# Patient Record
Sex: Female | Born: 1990 | Hispanic: Yes | Marital: Married | Smoking: Never smoker
Health system: Southern US, Community
[De-identification: ages and names within clinical notes are randomized; demographics above are authoritative.]

## PROBLEM LIST (undated history)

## (undated) ENCOUNTER — Emergency Department (HOSPITAL_BASED_OUTPATIENT_CLINIC_OR_DEPARTMENT_OTHER): Payer: BLUE CROSS/BLUE SHIELD

## (undated) DIAGNOSIS — Z789 Other specified health status: Secondary | ICD-10-CM

## (undated) DIAGNOSIS — Z8619 Personal history of other infectious and parasitic diseases: Secondary | ICD-10-CM

## (undated) HISTORY — PX: APPENDECTOMY: SHX54

## (undated) HISTORY — DX: Personal history of other infectious and parasitic diseases: Z86.19

---

## 2015-08-23 NOTE — L&D Delivery Note (Signed)
Delivery Note At 2:27 AM a viable female was delivered via Vaginal, Spontaneous Delivery (Presentation: vtx; LOA ).  APGAR: 9, 9; weight pending.   Placenta status: spontaneous, intact.  Cord:  with the following complications: none.  Anesthesia:  Epidural Episiotomy: Median Lacerations: Labial;Sulcus;2nd degree Suture Repair: 2.0 vicryl and 3-0 Vicryl rapide Est. Blood Loss (mL):  300  Mom to postpartum.  Baby to Couplet care / Skin to Skin.  Kimori Tartaglia D 05/08/2016, 3:04 AM

## 2016-01-04 LAB — OB RESULTS CONSOLE GC/CHLAMYDIA
Chlamydia: NEGATIVE
GC PROBE AMP, GENITAL: NEGATIVE

## 2016-01-04 LAB — OB RESULTS CONSOLE ANTIBODY SCREEN: ANTIBODY SCREEN: NEGATIVE

## 2016-01-04 LAB — OB RESULTS CONSOLE ABO/RH: RH Type: POSITIVE

## 2016-01-04 LAB — OB RESULTS CONSOLE HIV ANTIBODY (ROUTINE TESTING): HIV: NONREACTIVE

## 2016-01-04 LAB — OB RESULTS CONSOLE HEPATITIS B SURFACE ANTIGEN: Hepatitis B Surface Ag: NEGATIVE

## 2016-01-04 LAB — OB RESULTS CONSOLE RPR: RPR: NONREACTIVE

## 2016-01-04 LAB — OB RESULTS CONSOLE RUBELLA ANTIBODY, IGM: Rubella: IMMUNE

## 2016-04-07 LAB — OB RESULTS CONSOLE GC/CHLAMYDIA
CHLAMYDIA, DNA PROBE: NEGATIVE
Gonorrhea: NEGATIVE

## 2016-04-07 LAB — OB RESULTS CONSOLE GBS: STREP GROUP B AG: POSITIVE

## 2016-05-04 ENCOUNTER — Encounter (HOSPITAL_COMMUNITY): Payer: Self-pay | Admitting: *Deleted

## 2016-05-04 ENCOUNTER — Inpatient Hospital Stay (HOSPITAL_COMMUNITY)
Admission: AD | Admit: 2016-05-04 | Discharge: 2016-05-05 | Disposition: A | Discharge status: Home / Self Care | Payer: BLUE CROSS/BLUE SHIELD | Source: Ambulatory Visit | Attending: Obstetrics and Gynecology | Admitting: Obstetrics and Gynecology

## 2016-05-04 HISTORY — DX: Other specified health status: Z78.9

## 2016-05-04 NOTE — Discharge Instructions (Signed)
Third Trimester of Pregnancy The third trimester is from week 29 through week 42, months 7 through 9. This trimester is when your unborn baby (fetus) is growing very fast. At the end of the ninth month, the unborn baby is about 20 inches in length. It weighs about 6-10 pounds.  HOME CARE   Avoid all smoking, herbs, and alcohol. Avoid drugs not approved by your doctor.  Do not use any tobacco products, including cigarettes, chewing tobacco, and electronic cigarettes. If you need help quitting, ask your doctor. You may get counseling or other support to help you quit.  Only take medicine as told by your doctor. Some medicines are safe and some are not during pregnancy.  Exercise only as told by your doctor. Stop exercising if you start having cramps.  Eat regular, healthy meals.  Wear a good support bra if your breasts are tender.  Do not use hot tubs, steam rooms, or saunas.  Wear your seat belt when driving.  Avoid raw meat, uncooked cheese, and liter boxes and soil used by cats.  Take your prenatal vitamins.  Take 1500-2000 milligrams of calcium daily starting at the 20th week of pregnancy until you deliver your baby.  Try taking medicine that helps you poop (stool softener) as needed, and if your doctor approves. Eat more fiber by eating fresh fruit, vegetables, and whole grains. Drink enough fluids to keep your pee (urine) clear or pale yellow.  Take warm water baths (sitz baths) to soothe pain or discomfort caused by hemorrhoids. Use hemorrhoid cream if your doctor approves.  If you have puffy, bulging veins (varicose veins), wear support hose. Raise (elevate) your feet for 15 minutes, 3-4 times a day. Limit salt in your diet.  Avoid heavy lifting, wear low heels, and sit up straight.  Rest with your legs raised if you have leg cramps or low back pain.  Visit your dentist if you have not gone during your pregnancy. Use a soft toothbrush to brush your teeth. Be gentle when you  floss.  You can have sex (intercourse) unless your doctor tells you not to.  Do not travel far distances unless you must. Only do so with your doctor's approval.  Take prenatal classes.  Practice driving to the hospital.  Pack your hospital bag.  Prepare the baby's room.  Go to your doctor visits. GET HELP IF:  You are not sure if you are in labor or if your water has broken.  You are dizzy.  You have mild cramps or pressure in your lower belly (abdominal).  You have a nagging pain in your belly area.  You continue to feel sick to your stomach (nauseous), throw up (vomit), or have watery poop (diarrhea).  You have bad smelling fluid coming from your vagina.  You have pain with peeing (urination). GET HELP RIGHT AWAY IF:   You have a fever.  You are leaking fluid from your vagina.  You are spotting or bleeding from your vagina.  You have severe belly cramping or pain.  You lose or gain weight rapidly.  You have trouble catching your breath and have chest pain.  You notice sudden or extreme puffiness (swelling) of your face, hands, ankles, feet, or legs.  You have not felt the baby move in over an hour.  You have severe headaches that do not go away with medicine.  You have vision changes.   This information is not intended to replace advice given to you by your health care  provider. Make sure you discuss any questions you have with your health care provider.   Document Released: 11/02/2009 Document Revised: 08/29/2014 Document Reviewed: 10/09/2012 Elsevier Interactive Patient Education 2016 [employer] Fetal Movement Counts Patient Name: __________________________________________________ Patient Due Date: ____________________ Performing a fetal movement count is highly recommended in high-risk pregnancies, but it is good for every pregnant woman to do. Your health care provider may ask you to start counting fetal movements at 28 weeks of the pregnancy.  Fetal movements often increase:  After eating a full meal.  After physical activity.  After eating or drinking something sweet or cold.  At rest. Pay attention to when you feel the baby is most active. This will help you notice a pattern of your baby's sleep and wake cycles and what factors contribute to an increase in fetal movement. It is important to perform a fetal movement count at the same time each day when your baby is normally most active.  HOW TO COUNT FETAL MOVEMENTS 1. Find a quiet and comfortable area to sit or lie down on your left side. Lying on your left side provides the best blood and oxygen circulation to your baby. 2. Write down the day and time on a sheet of paper or in a journal. 3. Start counting kicks, flutters, swishes, rolls, or jabs in a 2-hour period. You should feel at least 10 movements within 2 hours. 4. If you do not feel 10 movements in 2 hours, wait 2-3 hours and count again. Look for a change in the pattern or not enough counts in 2 hours. SEEK MEDICAL CARE IF:  You feel less than 10 counts in 2 hours, tried twice.  There is no movement in over an hour.  The pattern is changing or taking longer each day to reach 10 counts in 2 hours.  You feel the baby is not moving as he or she usually does. Date: ____________ Movements: ____________ Start time: ____________ Doreatha MartinFinish time: ____________  Date: ____________ Movements: ____________ Start time: ____________ Doreatha MartinFinish time: ____________ Date: ____________ Movements: ____________ Start time: ____________ Doreatha MartinFinish time: ____________ Date: ____________ Movements: ____________ Start time: ____________ Doreatha MartinFinish time: ____________ Date: ____________ Movements: ____________ Start time: ____________ Doreatha MartinFinish time: ____________ Date: ____________ Movements: ____________ Start time: ____________ Doreatha MartinFinish time: ____________ Date: ____________ Movements: ____________ Start time: ____________ Doreatha MartinFinish time: ____________ Date:  ____________ Movements: ____________ Start time: ____________ Doreatha MartinFinish time: ____________  Date: ____________ Movements: ____________ Start time: ____________ Doreatha MartinFinish time: ____________ Date: ____________ Movements: ____________ Start time: ____________ Doreatha MartinFinish time: ____________ Date: ____________ Movements: ____________ Start time: ____________ Doreatha MartinFinish time: ____________ Date: ____________ Movements: ____________ Start time: ____________ Doreatha MartinFinish time: ____________ Date: ____________ Movements: ____________ Start time: ____________ Doreatha MartinFinish time: ____________ Date: ____________ Movements: ____________ Start time: ____________ Doreatha MartinFinish time: ____________ Date: ____________ Movements: ____________ Start time: ____________ Doreatha MartinFinish time: ____________  Date: ____________ Movements: ____________ Start time: ____________ Doreatha MartinFinish time: ____________ Date: ____________ Movements: ____________ Start time: ____________ Doreatha MartinFinish time: ____________ Date: ____________ Movements: ____________ Start time: ____________ Doreatha MartinFinish time: ____________ Date: ____________ Movements: ____________ Start time: ____________ Doreatha MartinFinish time: ____________ Date: ____________ Movements: ____________ Start time: ____________ Doreatha MartinFinish time: ____________ Date: ____________ Movements: ____________ Start time: ____________ Doreatha MartinFinish time: ____________ Date: ____________ Movements: ____________ Start time: ____________ Doreatha MartinFinish time: ____________  Date: ____________ Movements: ____________ Start time: ____________ Doreatha MartinFinish time: ____________ Date: ____________ Movements: ____________ Start time: ____________ Doreatha MartinFinish time: ____________ Date: ____________ Movements: ____________ Start time: ____________ Doreatha MartinFinish time: ____________ Date: ____________ Movements: ____________ Start time: ____________ Doreatha MartinFinish time: ____________ Date: ____________ Movements: ____________ Start time: ____________  Finish time: ____________ Date: ____________ Movements: ____________ Start  time: ____________ Doreatha Martin time: ____________ Date: ____________ Movements: ____________ Start time: ____________ Doreatha Martin time: ____________  Date: ____________ Movements: ____________ Start time: ____________ Doreatha Martin time: ____________ Date: ____________ Movements: ____________ Start time: ____________ Doreatha Martin time: ____________ Date: ____________ Movements: ____________ Start time: ____________ Doreatha Martin time: ____________ Date: ____________ Movements: ____________ Start time: ____________ Doreatha Martin time: ____________ Date: ____________ Movements: ____________ Start time: ____________ Doreatha Martin time: ____________ Date: ____________ Movements: ____________ Start time: ____________ Doreatha Martin time: ____________ Date: ____________ Movements: ____________ Start time: ____________ Doreatha Martin time: ____________  Date: ____________ Movements: ____________ Start time: ____________ Doreatha Martin time: ____________ Date: ____________ Movements: ____________ Start time: ____________ Doreatha Martin time: ____________ Date: ____________ Movements: ____________ Start time: ____________ Doreatha Martin time: ____________ Date: ____________ Movements: ____________ Start time: ____________ Doreatha Martin time: ____________ Date: ____________ Movements: ____________ Start time: ____________ Doreatha Martin time: ____________ Date: ____________ Movements: ____________ Start time: ____________ Doreatha Martin time: ____________ Date: ____________ Movements: ____________ Start time: ____________ Doreatha Martin time: ____________  Date: ____________ Movements: ____________ Start time: ____________ Doreatha Martin time: ____________ Date: ____________ Movements: ____________ Start time: ____________ Doreatha Martin time: ____________ Date: ____________ Movements: ____________ Start time: ____________ Doreatha Martin time: ____________ Date: ____________ Movements: ____________ Start time: ____________ Doreatha Martin time: ____________ Date: ____________ Movements: ____________ Start time: ____________ Doreatha Martin time:  ____________ Date: ____________ Movements: ____________ Start time: ____________ Doreatha Martin time: ____________ Date: ____________ Movements: ____________ Start time: ____________ Doreatha Martin time: ____________  Date: ____________ Movements: ____________ Start time: ____________ Doreatha Martin time: ____________ Date: ____________ Movements: ____________ Start time: ____________ Doreatha Martin time: ____________ Date: ____________ Movements: ____________ Start time: ____________ Doreatha Martin time: ____________ Date: ____________ Movements: ____________ Start time: ____________ Doreatha Martin time: ____________ Date: ____________ Movements: ____________ Start time: ____________ Doreatha Martin time: ____________ Date: ____________ Movements: ____________ Start time: ____________ Doreatha Martin time: ____________   This information is not intended to replace advice given to you by your health care provider. Make sure you discuss any questions you have with your health care provider.   Document Released: 09/07/2006 Document Revised: 08/29/2014 Document Reviewed: 06/04/2012 Elsevier Interactive Patient Education 2016 [employer].   Copyright  VHI. All rights reserved.  Braxton Hicks Contractions Contractions of the uterus can occur throughout pregnancy. Contractions are not always a sign that you are in labor.  WHAT ARE BRAXTON HICKS CONTRACTIONS?  Contractions that occur before labor are called Braxton Hicks contractions, or false labor. Toward the end of pregnancy (32-34 weeks), these contractions can develop more often and may become more forceful. This is not true labor because these contractions do not result in opening (dilatation) and thinning of the cervix. They are sometimes difficult to tell apart from true labor because these contractions can be forceful and people have different pain tolerances. You should not feel embarrassed if you go to the hospital with false labor. Sometimes, the only way to tell if you are in true labor is for your  health care provider to look for changes in the cervix. If there are no prenatal problems or other health problems associated with the pregnancy, it is completely safe to be sent home with false labor and await the onset of true labor. HOW CAN YOU TELL THE DIFFERENCE BETWEEN TRUE AND FALSE LABOR? False Labor  The contractions of false labor are usually shorter and not as hard as those of true labor.   The contractions are usually irregular.   The contractions are often felt in the front of the lower abdomen and in the groin.   The contractions may go away when you walk around or change positions  while lying down.   The contractions get weaker and are shorter lasting as time goes on.   The contractions do not usually become progressively stronger, regular, and closer together as with true labor.  True Labor  Contractions in true labor last 30-70 seconds, become very regular, usually become more intense, and increase in frequency.   The contractions do not go away with walking.   The discomfort is usually felt in the top of the uterus and spreads to the lower abdomen and low back.   True labor can be determined by your health care provider with an exam. This will show that the cervix is dilating and getting thinner.  WHAT TO REMEMBER  Keep up with your usual exercises and follow other instructions given by your health care provider.   Take medicines as directed by your health care provider.   Keep your regular prenatal appointments.   Eat and drink lightly if you think you are going into labor.   If Braxton Hicks contractions are making you uncomfortable:   Change your position from lying down or resting to walking, or from walking to resting.   Sit and rest in a tub of warm water.   Drink 2-3 glasses of water. Dehydration may cause these contractions.   Do slow and deep breathing several times an hour.  WHEN SHOULD I SEEK IMMEDIATE MEDICAL CARE? Seek  immediate medical care if:  Your contractions become stronger, more regular, and closer together.   You have fluid leaking or gushing from your vagina.   You have a fever.   You pass blood-tinged mucus.   You have vaginal bleeding.   You have continuous abdominal pain.   You have low back pain that you never had before.   You feel your baby's head pushing down and causing pelvic pressure.   Your baby is not moving as much as it used to.    This information is not intended to replace advice given to you by your health care provider. Make sure you discuss any questions you have with your health care provider.   Document Released: 08/08/2005 Document Revised: 08/13/2013 Document Reviewed: 05/20/2013 Elsevier Interactive Patient Education [employer]

## 2016-05-04 NOTE — MAU Note (Signed)
Pt reports contractions and pressure. Also states she is having a stabbing pain in her lower back.

## 2016-05-05 LAB — AMNISURE RUPTURE OF MEMBRANE (ROM) NOT AT ARMC: AMNISURE: NEGATIVE

## 2016-05-05 NOTE — Progress Notes (Signed)
Pt was given d/c instructions and pt and husband walked to car and they  came back inside MAU because she sneezed and something warm damp came out.  Notified Thressa ShellerHeather Hogan CNM and Amnisure was ordered.  No fluid coming out now.

## 2016-05-05 NOTE — Progress Notes (Signed)
Notified pt amnisure is neg, proceeded with d/c home as previous.

## 2016-05-06 ENCOUNTER — Encounter (HOSPITAL_COMMUNITY): Payer: Self-pay | Admitting: *Deleted

## 2016-05-06 ENCOUNTER — Telehealth (HOSPITAL_COMMUNITY): Payer: Self-pay | Admitting: *Deleted

## 2016-05-06 NOTE — Telephone Encounter (Signed)
Preadmission screen  

## 2016-05-07 ENCOUNTER — Inpatient Hospital Stay (HOSPITAL_COMMUNITY): Payer: BLUE CROSS/BLUE SHIELD | Admitting: Anesthesiology

## 2016-05-07 ENCOUNTER — Inpatient Hospital Stay (HOSPITAL_COMMUNITY)
Admission: AD | Admit: 2016-05-07 | Discharge: 2016-05-10 | DRG: 775 | Disposition: A | Discharge status: Home / Self Care | Payer: BLUE CROSS/BLUE SHIELD | Source: Ambulatory Visit | Attending: Obstetrics and Gynecology | Admitting: Obstetrics and Gynecology

## 2016-05-07 ENCOUNTER — Encounter (HOSPITAL_COMMUNITY): Payer: Self-pay | Admitting: Certified Nurse Midwife

## 2016-05-07 DIAGNOSIS — Z3A39 39 weeks gestation of pregnancy: Secondary | ICD-10-CM

## 2016-05-07 DIAGNOSIS — O4202 Full-term premature rupture of membranes, onset of labor within 24 hours of rupture: Secondary | ICD-10-CM | POA: Diagnosis present

## 2016-05-07 DIAGNOSIS — O4290 Premature rupture of membranes, unspecified as to length of time between rupture and onset of labor, unspecified weeks of gestation: Secondary | ICD-10-CM | POA: Diagnosis present

## 2016-05-07 DIAGNOSIS — O99824 Streptococcus B carrier state complicating childbirth: Secondary | ICD-10-CM | POA: Diagnosis present

## 2016-05-07 DIAGNOSIS — Z8249 Family history of ischemic heart disease and other diseases of the circulatory system: Secondary | ICD-10-CM | POA: Diagnosis not present

## 2016-05-07 LAB — POCT FERN TEST

## 2016-05-07 LAB — CBC
HEMATOCRIT: 33.2 % — AB (ref 36.0–46.0)
Hemoglobin: 11.4 g/dL — ABNORMAL LOW (ref 12.0–15.0)
MCH: 29.2 pg (ref 26.0–34.0)
MCHC: 34.3 g/dL (ref 30.0–36.0)
MCV: 85.1 fL (ref 78.0–100.0)
Platelets: 204 10*3/uL (ref 150–400)
RBC: 3.9 MIL/uL (ref 3.87–5.11)
RDW: 14.6 % (ref 11.5–15.5)
WBC: 12.3 10*3/uL — AB (ref 4.0–10.5)

## 2016-05-07 LAB — TYPE AND SCREEN
ABO/RH(D): O POS
ANTIBODY SCREEN: NEGATIVE

## 2016-05-07 LAB — ABO/RH: ABO/RH(D): O POS

## 2016-05-07 MED ORDER — EPHEDRINE 5 MG/ML INJ
10.0000 mg | INTRAVENOUS | Status: DC | PRN
  Filled 2016-05-07: qty 4

## 2016-05-07 MED ORDER — OXYCODONE-ACETAMINOPHEN 5-325 MG PO TABS: 2.0000 | ORAL_TABLET | ORAL | Status: DC | PRN

## 2016-05-07 MED ORDER — FENTANYL 2.5 MCG/ML BUPIVACAINE 1/10 % EPIDURAL INFUSION (WH - ANES)
14.0000 mL/h | INTRAMUSCULAR | Status: DC | PRN
Start: 2016-05-07 — End: 2016-05-08
  Administered 2016-05-07 – 2016-05-08 (×2): 14 mL/h via EPIDURAL
  Filled 2016-05-07 (×2): qty 125

## 2016-05-07 MED ORDER — PENICILLIN G POTASSIUM 5000000 UNITS IJ SOLR
2.5000 10*6.[IU] | INTRAMUSCULAR | Status: DC
  Administered 2016-05-07 (×2): 2.5 10*6.[IU] via INTRAVENOUS
  Filled 2016-05-07 (×6): qty 2.5

## 2016-05-07 MED ORDER — LIDOCAINE HCL (PF) 1 % IJ SOLN
INTRAMUSCULAR | Status: DC | PRN
  Administered 2016-05-07 (×2): 7 mL via EPIDURAL

## 2016-05-07 MED ORDER — OXYTOCIN 40 UNITS IN LACTATED RINGERS INFUSION - SIMPLE MED: 2.5000 [IU]/h | INTRAVENOUS | Status: DC

## 2016-05-07 MED ORDER — ACETAMINOPHEN 325 MG PO TABS: 650.0000 mg | ORAL_TABLET | ORAL | Status: DC | PRN

## 2016-05-07 MED ORDER — PENICILLIN G POTASSIUM 5000000 UNITS IJ SOLR
5.0000 10*6.[IU] | Freq: Once | INTRAVENOUS | Status: AC
  Administered 2016-05-07: 5 10*6.[IU] via INTRAVENOUS
  Filled 2016-05-07: qty 5

## 2016-05-07 MED ORDER — DIPHENHYDRAMINE HCL 50 MG/ML IJ SOLN: 12.5000 mg | INTRAMUSCULAR | Status: DC | PRN

## 2016-05-07 MED ORDER — PHENYLEPHRINE 40 MCG/ML (10ML) SYRINGE FOR IV PUSH (FOR BLOOD PRESSURE SUPPORT)
80.0000 ug | PREFILLED_SYRINGE | INTRAVENOUS | Status: DC | PRN
  Filled 2016-05-07: qty 5
  Filled 2016-05-07: qty 10

## 2016-05-07 MED ORDER — LACTATED RINGERS IV SOLN
500.0000 mL | Freq: Once | INTRAVENOUS | Status: AC
  Administered 2016-05-07: 500 mL via INTRAVENOUS

## 2016-05-07 MED ORDER — FENTANYL CITRATE (PF) 100 MCG/2ML IJ SOLN: 50.0000 ug | INTRAMUSCULAR | Status: DC | PRN

## 2016-05-07 MED ORDER — OXYTOCIN 40 UNITS IN LACTATED RINGERS INFUSION - SIMPLE MED
1.0000 m[IU]/min | INTRAVENOUS | Status: DC
  Administered 2016-05-07: 2 m[IU]/min via INTRAVENOUS

## 2016-05-07 MED ORDER — TERBUTALINE SULFATE 1 MG/ML IJ SOLN
0.2500 mg | Freq: Once | INTRAMUSCULAR | Status: DC | PRN
  Filled 2016-05-07: qty 1

## 2016-05-07 MED ORDER — LACTATED RINGERS IV SOLN: 500.0000 mL | INTRAVENOUS | Status: DC | PRN

## 2016-05-07 MED ORDER — OXYCODONE-ACETAMINOPHEN 5-325 MG PO TABS: 1.0000 | ORAL_TABLET | ORAL | Status: DC | PRN

## 2016-05-07 MED ORDER — ONDANSETRON HCL 4 MG/2ML IJ SOLN: 4.0000 mg | Freq: Four times a day (QID) | INTRAMUSCULAR | Status: DC | PRN

## 2016-05-07 MED ORDER — LIDOCAINE HCL (PF) 1 % IJ SOLN
30.0000 mL | INTRAMUSCULAR | Status: DC | PRN
  Filled 2016-05-07: qty 30

## 2016-05-07 MED ORDER — OXYTOCIN BOLUS FROM INFUSION
500.0000 mL | Freq: Once | INTRAVENOUS | Status: AC
  Administered 2016-05-08: 500 mL via INTRAVENOUS

## 2016-05-07 MED ORDER — LACTATED RINGERS IV SOLN
INTRAVENOUS | Status: DC
  Administered 2016-05-07: 14:00:00 via INTRAVENOUS

## 2016-05-07 MED ORDER — PHENYLEPHRINE 40 MCG/ML (10ML) SYRINGE FOR IV PUSH (FOR BLOOD PRESSURE SUPPORT)
80.0000 ug | PREFILLED_SYRINGE | INTRAVENOUS | Status: DC | PRN
  Filled 2016-05-07: qty 5

## 2016-05-07 MED ORDER — OXYTOCIN 40 UNITS IN LACTATED RINGERS INFUSION - SIMPLE MED
INTRAVENOUS | Status: AC
  Filled 2016-05-07: qty 1000

## 2016-05-07 MED ORDER — SOD CITRATE-CITRIC ACID 500-334 MG/5ML PO SOLN: 30.0000 mL | ORAL | Status: DC | PRN

## 2016-05-07 NOTE — H&P (Signed)
Janet Li is a 25 y.o. female  G2P0010 at 3639 6/7 weeks (EDD 05/08/16 by LMP c/w 22 week US) presents   for SROM at 5am with small LOF and then subsequent increased LOF.  In MAU, fern positive to confirm.  Prenatal care complicated by late start at 22 weeks and GBS positive.  OB History    Gravida Para Term Preterm AB Living   2       1     SAB TAB Ectopic Multiple Live Births   1            SAB X 1  Past Medical History:  Diagnosis Date  . Hx of varicella   . Medical history non-contributory    Past Surgical History:  Procedure Laterality Date  . APPENDECTOMY     Family History: family history includes Hypertension in her father. Social History:  reports that she has never smoked. She has never used smokeless tobacco. She reports that she does not drink alcohol or use drugs.     Maternal Diabetes: No Genetic Screening: too late to care Maternal Ultrasounds/Referrals: Normal Fetal Ultrasounds or other Referrals:  None Maternal Substance Abuse:  No Significant Maternal Medications:  None Significant Maternal Lab Results:  Lab values include: Group B Strep positive Other Comments:  None  Review of Systems  Gastrointestinal: Negative for abdominal pain.  Neurological: Negative for headaches.   Maternal Medical History:  Reason for admission: Rupture of membranes.   Contractions: Onset was 6-12 hours ago.   Frequency: irregular.   Perceived severity is mild.    Fetal activity: Perceived fetal activity is normal.    Prenatal Complications - Diabetes: none.    Dilation: 1 Effacement (%): Thick Station: -3 Exam by:: Melburn PopperMichelle Williams, RN Blood pressure 114/68, pulse 95, temperature 98.7 F (37.1 C), temperature source Oral, resp. rate 16, height 5\' 5"  (1.651 m), weight 89.8 kg (198 lb). Maternal Exam:  Uterine Assessment: Contraction strength is mild.  Contraction frequency is irregular.   Abdomen: Fetal presentation: vertex  Introitus: Normal vulva. Normal  vagina.  Ferning test: positive.  Amniotic fluid character: clear.     Physical Exam  Constitutional: She appears well-developed.  Respiratory: Effort normal.  Neurological: She is alert.  Psychiatric: She has a normal mood and affect.    Prenatal labs: ABO, Rh: O/Positive/-- (05/15 0000) Antibody: Negative (05/15 0000) Rubella: Immune (05/15 0000) RPR: Nonreactive (05/15 0000)  HBsAg: Negative (05/15 0000)  HIV: Non-reactive (05/15 0000)  GBS: Positive (08/17 0000)  Essential panel negative One hour GCT 78   Assessment/Plan: Pt with SROM and no labor, will admit and try pitocin to augment.  If does not respond, will change to cytotec.  PCN for +GBS.   Oliver PilaICHARDSON,Owen Pagnotta W 05/07/2016, 2:40 PM

## 2016-05-07 NOTE — Anesthesia Pain Management Evaluation Note (Signed)
  CRNA Pain Management Visit Note  Patient: Janet Li, 25 y.o., female  "Hello I am a [facility] the [facility]. We have an anesthesia team available at all times to provide care throughout the hospital, including epidural management and anesthesia for C-section. I don't know your plan for the delivery whether it a natural birth, water birth, IV sedation, nitrous supplementation, doula or epidural, but we want to meet your pain goals."   1.Was your pain managed to your expectations on prior hospitalizations?   No prior hospitalizations  2.What is your expectation for pain management during this hospitalization?     Epidural  3.How can we help you reach that goal? Patient plans on epidural at some point patient is dilated to a 1 at time of visit  Record the patient's initial score and the patient's pain goal.   Pain: 4  Pain Goal: 3 [facility] wants you to be able to say your pain was always managed very well.  Rica RecordsICKELTON,Noele Icenhour 05/07/2016

## 2016-05-07 NOTE — Anesthesia Preprocedure Evaluation (Signed)

## 2016-05-07 NOTE — Progress Notes (Signed)
Comfortable with epidural Afeb, VSS FHT- Cat I VE-8-9 per RN Continue pitocin and PCN, anticipate SVD

## 2016-05-07 NOTE — MAU Note (Signed)
Pt states she awoke at 5AM to a gush of fluid and it has continued to leak since then. Pt denies vaginal bleeding or ctxs. Fetus is active.

## 2016-05-07 NOTE — Anesthesia Procedure Notes (Signed)
Epidural Patient location during procedure: OB Start time: 05/07/2016 6:44 PM End time: 05/07/2016 6:48 PM  Staffing Anesthesiologist: Leilani AbleHATCHETT, Sanae Willetts Performed: anesthesiologist   Preanesthetic Checklist Completed: patient identified, surgical consent, pre-op evaluation, timeout performed, IV checked, risks and benefits discussed and monitors and equipment checked  Epidural Patient position: sitting Prep: site prepped and draped and DuraPrep Patient monitoring: continuous pulse ox and blood pressure Approach: midline Location: L3-L4 Injection technique: LOR air  Needle:  Needle type: Tuohy  Needle gauge: 17 G Needle length: 9 cm and 9 Needle insertion depth: 5 cm cm Catheter type: closed end flexible Catheter size: 19 Gauge Catheter at skin depth: 10 cm Test dose: negative and Other  Assessment Sensory level: T9 Events: blood not aspirated, injection not painful, no injection resistance, negative IV test and no paresthesia  Additional Notes Reason for block:procedure for pain

## 2016-05-08 ENCOUNTER — Encounter (HOSPITAL_COMMUNITY): Payer: Self-pay | Admitting: *Deleted

## 2016-05-08 LAB — RPR: RPR: NONREACTIVE

## 2016-05-08 MED ORDER — WITCH HAZEL-GLYCERIN EX PADS: 1.0000 "application " | MEDICATED_PAD | CUTANEOUS | Status: DC | PRN

## 2016-05-08 MED ORDER — MAGNESIUM HYDROXIDE 400 MG/5ML PO SUSP: 30.0000 mL | ORAL | Status: DC | PRN

## 2016-05-08 MED ORDER — MEASLES, MUMPS & RUBELLA VAC ~~LOC~~ INJ
0.5000 mL | INJECTION | Freq: Once | SUBCUTANEOUS | Status: DC
  Filled 2016-05-08: qty 0.5

## 2016-05-08 MED ORDER — IBUPROFEN 600 MG PO TABS
600.0000 mg | ORAL_TABLET | Freq: Four times a day (QID) | ORAL | Status: DC
  Administered 2016-05-08 – 2016-05-10 (×10): 600 mg via ORAL
  Filled 2016-05-08 (×10): qty 1

## 2016-05-08 MED ORDER — METHYLERGONOVINE MALEATE 0.2 MG PO TABS: 0.2000 mg | ORAL_TABLET | ORAL | Status: DC | PRN

## 2016-05-08 MED ORDER — COCONUT OIL OIL: 1.0000 "application " | TOPICAL_OIL | Status: DC | PRN

## 2016-05-08 MED ORDER — ACETAMINOPHEN 325 MG PO TABS
650.0000 mg | ORAL_TABLET | ORAL | Status: DC | PRN
  Administered 2016-05-08 – 2016-05-09 (×2): 650 mg via ORAL
  Filled 2016-05-08 (×2): qty 2

## 2016-05-08 MED ORDER — OXYCODONE HCL 5 MG PO TABS: 5.0000 mg | ORAL_TABLET | ORAL | Status: DC | PRN

## 2016-05-08 MED ORDER — DIPHENHYDRAMINE HCL 25 MG PO CAPS: 25.0000 mg | ORAL_CAPSULE | Freq: Four times a day (QID) | ORAL | Status: DC | PRN

## 2016-05-08 MED ORDER — BENZOCAINE-MENTHOL 20-0.5 % EX AERO
1.0000 "application " | INHALATION_SPRAY | CUTANEOUS | Status: DC | PRN
  Administered 2016-05-08: 1 via TOPICAL
  Filled 2016-05-08: qty 56

## 2016-05-08 MED ORDER — OXYCODONE HCL 5 MG PO TABS: 10.0000 mg | ORAL_TABLET | ORAL | Status: DC | PRN

## 2016-05-08 MED ORDER — SIMETHICONE 80 MG PO CHEW: 80.0000 mg | CHEWABLE_TABLET | ORAL | Status: DC | PRN

## 2016-05-08 MED ORDER — PRENATAL MULTIVITAMIN CH
1.0000 | ORAL_TABLET | Freq: Every day | ORAL | Status: DC
  Administered 2016-05-08 – 2016-05-10 (×3): 1 via ORAL
  Filled 2016-05-08 (×3): qty 1

## 2016-05-08 MED ORDER — DIBUCAINE 1 % RE OINT: 1.0000 "application " | TOPICAL_OINTMENT | RECTAL | Status: DC | PRN

## 2016-05-08 MED ORDER — TETANUS-DIPHTH-ACELL PERTUSSIS 5-2.5-18.5 LF-MCG/0.5 IM SUSP: 0.5000 mL | Freq: Once | INTRAMUSCULAR | Status: DC

## 2016-05-08 MED ORDER — SENNOSIDES-DOCUSATE SODIUM 8.6-50 MG PO TABS
2.0000 | ORAL_TABLET | ORAL | Status: DC
  Administered 2016-05-08 – 2016-05-09 (×2): 2 via ORAL
  Filled 2016-05-08 (×2): qty 2

## 2016-05-08 MED ORDER — METHYLERGONOVINE MALEATE 0.2 MG/ML IJ SOLN
0.2000 mg | INTRAMUSCULAR | Status: DC | PRN
Start: 2016-05-08 — End: 2016-05-10

## 2016-05-08 MED ORDER — ZOLPIDEM TARTRATE 5 MG PO TABS: 5.0000 mg | ORAL_TABLET | Freq: Every evening | ORAL | Status: DC | PRN

## 2016-05-08 MED ORDER — ONDANSETRON HCL 4 MG PO TABS: 4.0000 mg | ORAL_TABLET | ORAL | Status: DC | PRN

## 2016-05-08 MED ORDER — ONDANSETRON HCL 4 MG/2ML IJ SOLN: 4.0000 mg | INTRAMUSCULAR | Status: DC | PRN

## 2016-05-08 NOTE — Anesthesia Postprocedure Evaluation (Signed)
Anesthesia Post Note  Patient: Janet Li  Procedure(s) Performed: * No procedures listed *  Patient location during evaluation: Mother Baby Anesthesia Type: Epidural Level of consciousness: awake, awake and alert, oriented and patient cooperative Pain management: pain level controlled Vital Signs Assessment: post-procedure vital signs reviewed and stable Respiratory status: spontaneous breathing, nonlabored ventilation and respiratory function stable Cardiovascular status: stable Postop Assessment: patient able to bend at knees, no headache, no backache and no signs of nausea or vomiting Anesthetic complications: no     Last Vitals:  Vitals:   05/08/16 0509 05/08/16 0625  BP: (!) 93/53 97/60  Pulse: 88 83  Resp: 17 18  Temp: 36.4 C 37.2 C    Last Pain:  Vitals:   05/08/16 0625  TempSrc: Oral  PainSc: 0-No pain   Pain Goal:                 [name] L

## 2016-05-09 NOTE — Progress Notes (Signed)
Post Partum Day 1 Subjective: no complaints, up ad lib, voiding, tolerating PO, + flatus and bonding wellw ith baby - baby to start bili therapy. Pt denies any headaches or blurry vision. Doing well  Objective: Blood pressure (!) 97/53, pulse 82, temperature 98.7 F (37.1 C), temperature source Oral, resp. rate 18, height 5\' 5"  (1.651 m), weight 198 lb (89.8 kg), SpO2 97 %, unknown if currently breastfeeding.  Physical Exam:  General: alert, cooperative and no distress Lochia: appropriate Uterine Fundus: firm Incision: n/a DVT Evaluation: No evidence of DVT seen on physical exam. Negative Homan's sign.   Recent Labs  05/07/16 1304  HGB 11.4*  HCT 33.2*    Assessment/Plan: Plan for discharge tomorrow and Breastfeeding   LOS: 2 days   Sharol GivenCecilia Worema Nosson Wender 05/09/2016, 9:49 AM

## 2016-05-09 NOTE — Lactation Note (Signed)
This note was copied from a baby's chart. Lactation Consultation Note  Patient Name: Janet Li Reason for consult: Follow-up assessment Baby at 37 hr of life. Baby at 5% wt loss, elevated bilirubin, 12 stools, 4 wets, and 15 bf.Discussed baby behavior, feeding frequency, pumping, baby belly size, voids, wt loss, breast changes, and nipple care. Demonstrated manual expression, colostrum noted bilaterally, extra spoons given. Mom is aware of lactation services and support group. Mom will put baby to breast on demand 8+/24hr. She will used techniques discussed to be sure baby is actively eating at the breast instead of flutter sucking. She will manually express after bf and spoon feed baby any milk she gets. She has a hand pump that she would like to use in addition to bf and manual expression. She will call for support as needed.     Maternal Data    Feeding Feeding Type: Breast Fed  LATCH Score/Interventions Latch: Grasps breast easily, tongue down, lips flanged, rhythmical sucking. Intervention(s): Breast compression;Adjust position;Breast massage  Audible Swallowing: Spontaneous and intermittent Intervention(s): Hand expression;Skin to skin Intervention(s): Alternate breast massage  Type of Nipple: Everted at rest and after stimulation  Comfort (Breast/Nipple): Soft / non-tender  Interventions (Mild/moderate discomfort): Hand expression  Hold (Positioning): Assistance needed to correctly position infant at breast and maintain latch. Intervention(s): Support Pillows;Position options  LATCH Score: 9  Lactation Tools Discussed/Used     Consult Status Consult Status: Follow-up Date: 05/10/16 Follow-up type: In-patient    Janet Li Li, 4:16 PM

## 2016-05-09 NOTE — Progress Notes (Signed)
Admission nutrition screen triggered for unintentional weight loss > 10 lbs within the last month. . Patients chart reviewed and assessed  for nutritional risk. Patient is determined to be at low nutrition  risk.    Rayelynn Loyal M.Ed. R.D. LDN Neonatal Nutrition Support Specialist/RD III Pager [phone]      Phone [phone]  

## 2016-05-09 NOTE — Lactation Note (Signed)
This note was copied from a baby's chart. Lactation Consultation Note New mom has everted nipples. Hand expression w/easy flow of colostrum. Baby had 5% weight loss. Has had 11 stools and 4 voids since birth. BF well mom stated. Mom encouraged to feed baby 8-12 times/24 hours and with feeding cues. Referred to Baby and Me Book in Breastfeeding section Pg. 22-23 for position options and Proper latch demonstration. Educated about newborn behavior, I&O, STS, supply and demand. WH/LC brochure given w/resources, support groups and LC services. Mom has her own personal pump. Discussed cluster feeding.  Patient Name: Janet Li Today's Date: 05/09/2016 Reason for consult: Initial assessment   Maternal Data Has patient been taught Hand Expression?: Yes Does the patient have breastfeeding experience prior to this delivery?: No  Feeding    LATCH Score/Interventions       Type of Nipple: Everted at rest and after stimulation  Comfort (Breast/Nipple): Filling, red/small blisters or bruises, mild/mod discomfort  Problem noted: Mild/Moderate discomfort  Intervention(s): Breastfeeding basics reviewed;Support Pillows;Position options;Skin to skin     Lactation Tools Discussed/Used     Consult Status Consult Status: Follow-up Date: 05/10/16 Follow-up type: In-patient    Jamelle Noy, Diamond NickelLAURA G 05/09/2016, 3:24 AM

## 2016-05-10 MED ORDER — IBUPROFEN 600 MG PO TABS
600.0000 mg | ORAL_TABLET | Freq: Four times a day (QID) | ORAL | 0 refills | Status: DC
Start: 2016-05-10 — End: 2017-08-08

## 2016-05-10 MED ORDER — ACETAMINOPHEN 325 MG PO TABS: 650.0000 mg | ORAL_TABLET | ORAL | 1 refills | Status: DC | PRN

## 2016-05-10 NOTE — Progress Notes (Signed)
Post Partum Day 2 Subjective: no complaints, up ad lib and tolerating PO  Objective: Blood pressure (!) 99/48, pulse 79, temperature 98.2 F (36.8 C), temperature source Oral, resp. rate 18, height 5\' 5"  (1.651 m), weight 89.8 kg (198 lb), SpO2 97 %, unknown if currently breastfeeding.  Physical Exam:  General: sleeping comfortablly Lochia: appropriate     Recent Labs  05/07/16 1304  HGB 11.4*  HCT 33.2*    Assessment/Plan: Discharge home  Motrin and tylenol for pain   LOS: 3 days   Liseth Wann W 05/10/2016, 9:03 AM

## 2016-05-10 NOTE — Lactation Note (Signed)
This note was copied from a baby's chart. Lactation Consultation Note  Patient Name: Janet Li NWGNF'AToday's Date: 05/10/2016 Reason for consult: Follow-up assessment  Baby is 3758 hours old and has been consistent at the breast .  Mom milk is coming in and breast are heavier and fuller. Increased swallows noted and increased with breast compressions. @ the consult worked on football position to increased moms confidence level.  Sore nipple and engorgement prevention and tx reviewed.  Per mom has  Pump at home.  Baby is at 7 % weight loss , but voids and stools are a good  Indication. Mother informed of post-discharge support and given phone number to the lactation department, including services for phone call assistance; out-patient appointments; and breastfeeding support group. List of other breastfeeding resources in the community given in the handout. Encouraged mother to call for problems or concerns related to breastfeeding.   Maternal Data Has patient been taught Hand Expression?: Yes  Feeding Feeding Type: Breast Fed Length of feed:  (baby still feeding , multiply swallows )  LATCH Score/Interventions Latch: Grasps breast easily, tongue down, lips flanged, rhythmical sucking. Intervention(s): Adjust position;Assist with latch;Breast massage;Breast compression  Audible Swallowing: Spontaneous and intermittent Intervention(s): Skin to skin  Type of Nipple: Everted at rest and after stimulation  Comfort (Breast/Nipple): Filling, red/small blisters or bruises, mild/mod discomfort  Problem noted: Filling Interventions (Mild/moderate discomfort): Hand expression;Hand massage  Hold (Positioning): No assistance needed to correctly position infant at breast. Intervention(s): Breastfeeding basics reviewed;Support Pillows;Position options;Skin to skin  LATCH Score: 9  Lactation Tools Discussed/Used WIC Program: No   Consult Status Consult Status: Complete Date:  05/10/16 Follow-up type: In-patient    Janet Li, Janet Li 05/10/2016, 12:48 PM

## 2016-05-10 NOTE — Discharge Summary (Signed)
OB Discharge Summary     Patient Name: Janet Li DOB: [birth year] MRN: [id number]  Date of admission: 05/07/2016 Delivering MD: Jackelyn KnifeMEISINGER, TODD   Date of discharge: 05/10/2016  Admitting diagnosis: 40 WKS, WATER BROKE Intrauterine pregnancy: 1882w0d     Secondary diagnosis:  Active Problems:   Premature rupture of membranes  Additional problems: none     Discharge diagnosis: Term Pregnancy Delivered                                                                                                Post partum procedures:none  Augmentation: Pitocin  Complications: None  Hospital course:  Onset of Labor With Vaginal Delivery     25 y.o. yo G2P1011 at 2682w0d was admitted in Latent Labor on 05/07/2016. Patient had an uncomplicated labor course as follows:  Membrane Rupture Time/Date: 5:00 AM ,05/07/2016   Intrapartum Procedures: Episiotomy: Median [2]                                         Lacerations:  Sulcus [9];2nd degree [3]  Patient had a delivery of a Viable infant. 05/08/2016  Information for the patient's newborn:  Janet Li, Janet Li [[id number]][[id number]]  Delivery Method: Vaginal, Spontaneous Delivery (Filed from Delivery Summary)    Pateint had an uncomplicated postpartum course.  She is ambulating, tolerating a regular diet, passing flatus, and urinating well. Patient is discharged home in stable condition on 05/10/16.    Physical exam Vitals:   05/08/16 1715 05/09/16 0541 05/09/16 1800 05/10/16 0540  BP: (!) 108/58 (!) 97/53 113/60 (!) 99/48  Pulse: 76 82 90 79  Resp: 16 18 18 18   Temp: 98.4 F (36.9 C) 98.7 F (37.1 C) 98.3 F (36.8 C) 98.2 F (36.8 C)  TempSrc: Oral Oral Oral Oral  SpO2:      Weight:      Height:       General: alert and cooperative Lochia: appropriate Uterine Fundus: firm  Labs: Lab Results  Component Value Date   WBC 12.3 (H) 05/07/2016   HGB 11.4 (L) 05/07/2016   HCT 33.2 (L) 05/07/2016   MCV 85.1 05/07/2016   PLT 204 05/07/2016    No flowsheet data found.  Discharge instruction: per After Visit Summary and "Baby and Me Booklet".  After visit meds:    Medication List    TAKE these medications   acetaminophen 325 MG tablet Commonly known as:  TYLENOL Take 650 mg by mouth every 6 (six) hours as needed for moderate pain. What changed:  Another medication with the same name was added. Make sure you understand how and when to take each.   acetaminophen 325 MG tablet Commonly known as:  TYLENOL Take 2 tablets (650 mg total) by mouth every 4 (four) hours as needed (for pain scale < 4). What changed:  You were already taking a medication with the same name, and this prescription was added. Make sure you understand how and when to take each.   ibuprofen 600 MG  tablet Commonly known as:  ADVIL,MOTRIN Take 1 tablet (600 mg total) by mouth every 6 (six) hours.   prenatal multivitamin Tabs tablet Take 1 tablet by mouth daily at 12 noon.       Diet: routine diet  Activity: Advance as tolerated. Pelvic rest for 6 weeks.   Outpatient follow up:6 weeks Follow up Appt:Future Appointments Date Time Provider Department Center  05/12/2016 12:00 AM [facility] RO[facility] None   Follow up Visit:No Follow-up on file.  Postpartum contraception: Undecided  Newborn Data: Live born female  Birth Weight: 7 lb 7.4 oz (3385 g) APGAR: 9, 9  Baby Feeding: Breast Disposition:home with mother   05/10/2016 Oliver Pila, MD

## 2016-05-12 ENCOUNTER — Inpatient Hospital Stay (HOSPITAL_COMMUNITY): Admission: RE | Admit: 2016-05-12 | Payer: BLUE CROSS/BLUE SHIELD | Source: Ambulatory Visit

## 2016-08-22 NOTE — L&D Delivery Note (Signed)
Delivery Note Pt progressed to complete and pushed well with 2 ctx.  At 2:12 PM a viable female was delivered via Vaginal, Spontaneous (Presentation: vtx; LOA ).  APGAR: 8, 9; weight pending.   Placenta status: spontaneous, intact.  Cord:  with the following complications: none.    Anesthesia: Epidural  Episiotomy: None Lacerations:  2nd degree Suture Repair: 3.0 vicryl rapide Est. Blood Loss (mL):  200  Mom to postpartum.  Baby to Couplet care / Skin to Skin.  Leighton Roachodd D Roxine Whittinghill 08/08/2017, 2:34 PM

## 2016-12-29 LAB — OB RESULTS CONSOLE ABO/RH: "RH Type ": POSITIVE

## 2016-12-29 LAB — OB RESULTS CONSOLE RPR: RPR: NONREACTIVE

## 2016-12-29 LAB — OB RESULTS CONSOLE HEPATITIS B SURFACE ANTIGEN: HEP B S AG: NEGATIVE

## 2016-12-29 LAB — OB RESULTS CONSOLE GC/CHLAMYDIA
CHLAMYDIA, DNA PROBE: NEGATIVE
GC PROBE AMP, GENITAL: NEGATIVE

## 2016-12-29 LAB — OB RESULTS CONSOLE GBS: GBS: POSITIVE

## 2016-12-29 LAB — OB RESULTS CONSOLE HIV ANTIBODY (ROUTINE TESTING): HIV: NONREACTIVE

## 2016-12-29 LAB — OB RESULTS CONSOLE ANTIBODY SCREEN: ANTIBODY SCREEN: NEGATIVE

## 2016-12-29 LAB — OB RESULTS CONSOLE RUBELLA ANTIBODY, IGM: Rubella: IMMUNE

## 2017-08-07 ENCOUNTER — Telehealth (HOSPITAL_COMMUNITY): Payer: Self-pay | Admitting: *Deleted

## 2017-08-07 ENCOUNTER — Encounter (HOSPITAL_COMMUNITY): Payer: Self-pay | Admitting: *Deleted

## 2017-08-07 NOTE — Telephone Encounter (Signed)
Preadmission screen  

## 2017-08-08 ENCOUNTER — Inpatient Hospital Stay (HOSPITAL_COMMUNITY): Payer: BLUE CROSS/BLUE SHIELD | Admitting: Anesthesiology

## 2017-08-08 ENCOUNTER — Encounter (HOSPITAL_COMMUNITY): Payer: Self-pay

## 2017-08-08 ENCOUNTER — Other Ambulatory Visit: Payer: Self-pay

## 2017-08-08 ENCOUNTER — Inpatient Hospital Stay (HOSPITAL_COMMUNITY)
Admission: RE | Admit: 2017-08-08 | Discharge: 2017-08-09 | DRG: 807 | Disposition: A | Discharge status: Home / Self Care | Payer: BLUE CROSS/BLUE SHIELD | Source: Ambulatory Visit | Attending: Obstetrics and Gynecology | Admitting: Obstetrics and Gynecology

## 2017-08-08 ENCOUNTER — Inpatient Hospital Stay (HOSPITAL_COMMUNITY)
Admission: RE | Admit: 2017-08-08 | Discharge: 2017-08-08 | Disposition: A | Discharge status: Home / Self Care | Payer: BLUE CROSS/BLUE SHIELD | Source: Ambulatory Visit | Attending: Obstetrics and Gynecology | Admitting: Obstetrics and Gynecology

## 2017-08-08 DIAGNOSIS — Z3483 Encounter for supervision of other normal pregnancy, third trimester: Secondary | ICD-10-CM | POA: Diagnosis present

## 2017-08-08 DIAGNOSIS — O99824 Streptococcus B carrier state complicating childbirth: Principal | ICD-10-CM | POA: Diagnosis present

## 2017-08-08 DIAGNOSIS — Z3A39 39 weeks gestation of pregnancy: Secondary | ICD-10-CM

## 2017-08-08 LAB — CBC
HEMATOCRIT: 33.8 % — AB (ref 36.0–46.0)
Hemoglobin: 11 g/dL — ABNORMAL LOW (ref 12.0–15.0)
MCH: 27.6 pg (ref 26.0–34.0)
MCHC: 32.5 g/dL (ref 30.0–36.0)
MCV: 84.7 fL (ref 78.0–100.0)
Platelets: 194 10*3/uL (ref 150–400)
RBC: 3.99 MIL/uL (ref 3.87–5.11)
RDW: 14.6 % (ref 11.5–15.5)
WBC: 10.5 10*3/uL (ref 4.0–10.5)

## 2017-08-08 LAB — TYPE AND SCREEN
ABO/RH(D): O POS
ANTIBODY SCREEN: NEGATIVE

## 2017-08-08 LAB — RPR: RPR Ser Ql: NONREACTIVE

## 2017-08-08 MED ORDER — LIDOCAINE HCL (PF) 1 % IJ SOLN
INTRAMUSCULAR | Status: DC | PRN
  Administered 2017-08-08 (×2): 5 mL via EPIDURAL

## 2017-08-08 MED ORDER — ONDANSETRON HCL 4 MG PO TABS: 4.0000 mg | ORAL_TABLET | ORAL | Status: DC | PRN

## 2017-08-08 MED ORDER — MEASLES, MUMPS & RUBELLA VAC ~~LOC~~ INJ: 0.5000 mL | INJECTION | Freq: Once | SUBCUTANEOUS | Status: DC

## 2017-08-08 MED ORDER — COCONUT OIL OIL: 1.0000 "application " | TOPICAL_OIL | Status: DC | PRN

## 2017-08-08 MED ORDER — LACTATED RINGERS IV SOLN
INTRAVENOUS | Status: DC
  Administered 2017-08-08 (×2): via INTRAVENOUS

## 2017-08-08 MED ORDER — DIPHENHYDRAMINE HCL 25 MG PO CAPS: 25.0000 mg | ORAL_CAPSULE | Freq: Four times a day (QID) | ORAL | Status: DC | PRN

## 2017-08-08 MED ORDER — OXYTOCIN 40 UNITS IN LACTATED RINGERS INFUSION - SIMPLE MED
2.5000 [IU]/h | INTRAVENOUS | Status: DC
  Administered 2017-08-08: 2.5 [IU]/h via INTRAVENOUS

## 2017-08-08 MED ORDER — PENICILLIN G POT IN DEXTROSE 60000 UNIT/ML IV SOLN
3.0000 10*6.[IU] | INTRAVENOUS | Status: DC
  Administered 2017-08-08: 3 10*6.[IU] via INTRAVENOUS
  Filled 2017-08-08 (×4): qty 50

## 2017-08-08 MED ORDER — OXYCODONE-ACETAMINOPHEN 5-325 MG PO TABS: 1.0000 | ORAL_TABLET | ORAL | Status: DC | PRN

## 2017-08-08 MED ORDER — OXYCODONE HCL 5 MG PO TABS: 10.0000 mg | ORAL_TABLET | ORAL | Status: DC | PRN

## 2017-08-08 MED ORDER — SIMETHICONE 80 MG PO CHEW: 80.0000 mg | CHEWABLE_TABLET | ORAL | Status: DC | PRN

## 2017-08-08 MED ORDER — MEPERIDINE HCL 25 MG/ML IJ SOLN: 6.2500 mg | INTRAMUSCULAR | Status: DC | PRN

## 2017-08-08 MED ORDER — MAGNESIUM HYDROXIDE 400 MG/5ML PO SUSP: 30.0000 mL | ORAL | Status: DC | PRN

## 2017-08-08 MED ORDER — PHENYLEPHRINE 40 MCG/ML (10ML) SYRINGE FOR IV PUSH (FOR BLOOD PRESSURE SUPPORT)
80.0000 ug | PREFILLED_SYRINGE | INTRAVENOUS | Status: DC | PRN
  Filled 2017-08-08: qty 5

## 2017-08-08 MED ORDER — BENZOCAINE-MENTHOL 20-0.5 % EX AERO
1.0000 "application " | INHALATION_SPRAY | CUTANEOUS | Status: DC | PRN
  Filled 2017-08-08: qty 56

## 2017-08-08 MED ORDER — ACETAMINOPHEN 325 MG PO TABS
650.0000 mg | ORAL_TABLET | ORAL | Status: DC | PRN
Start: 2017-08-08 — End: 2017-08-09

## 2017-08-08 MED ORDER — IBUPROFEN 600 MG PO TABS
600.0000 mg | ORAL_TABLET | Freq: Four times a day (QID) | ORAL | Status: DC
  Administered 2017-08-08 – 2017-08-09 (×4): 600 mg via ORAL
  Filled 2017-08-08 (×4): qty 1

## 2017-08-08 MED ORDER — EPHEDRINE 5 MG/ML INJ
10.0000 mg | INTRAVENOUS | Status: DC | PRN
Start: 2017-08-08 — End: 2017-08-08
  Filled 2017-08-08: qty 2

## 2017-08-08 MED ORDER — DIPHENHYDRAMINE HCL 50 MG/ML IJ SOLN: 12.5000 mg | INTRAMUSCULAR | Status: DC | PRN

## 2017-08-08 MED ORDER — EPHEDRINE 5 MG/ML INJ
10.0000 mg | INTRAVENOUS | Status: DC | PRN
  Filled 2017-08-08: qty 2

## 2017-08-08 MED ORDER — OXYTOCIN 40 UNITS IN LACTATED RINGERS INFUSION - SIMPLE MED
1.0000 m[IU]/min | INTRAVENOUS | Status: DC
  Administered 2017-08-08: 6 m[IU]/min via INTRAVENOUS
  Administered 2017-08-08: 10 m[IU]/min via INTRAVENOUS
  Administered 2017-08-08: 4 m[IU]/min via INTRAVENOUS
  Administered 2017-08-08: 12 m[IU]/min via INTRAVENOUS
  Administered 2017-08-08: 2 m[IU]/min via INTRAVENOUS
  Administered 2017-08-08: 8 m[IU]/min via INTRAVENOUS
  Filled 2017-08-08: qty 1000

## 2017-08-08 MED ORDER — PHENYLEPHRINE 40 MCG/ML (10ML) SYRINGE FOR IV PUSH (FOR BLOOD PRESSURE SUPPORT)
80.0000 ug | PREFILLED_SYRINGE | INTRAVENOUS | Status: DC | PRN
  Filled 2017-08-08: qty 10
  Filled 2017-08-08: qty 5

## 2017-08-08 MED ORDER — ONDANSETRON HCL 4 MG/2ML IJ SOLN: 4.0000 mg | Freq: Once | INTRAMUSCULAR | Status: DC | PRN

## 2017-08-08 MED ORDER — LIDOCAINE HCL (PF) 1 % IJ SOLN
30.0000 mL | INTRAMUSCULAR | Status: DC | PRN
  Filled 2017-08-08: qty 30

## 2017-08-08 MED ORDER — OXYTOCIN BOLUS FROM INFUSION
500.0000 mL | Freq: Once | INTRAVENOUS | Status: AC
  Administered 2017-08-08: 500 mL via INTRAVENOUS

## 2017-08-08 MED ORDER — TETANUS-DIPHTH-ACELL PERTUSSIS 5-2.5-18.5 LF-MCG/0.5 IM SUSP: 0.5000 mL | Freq: Once | INTRAMUSCULAR | Status: DC

## 2017-08-08 MED ORDER — PRENATAL MULTIVITAMIN CH
1.0000 | ORAL_TABLET | Freq: Every day | ORAL | Status: DC
  Administered 2017-08-09: 1 via ORAL
  Filled 2017-08-08: qty 1

## 2017-08-08 MED ORDER — ACETAMINOPHEN 325 MG PO TABS: 650.0000 mg | ORAL_TABLET | ORAL | Status: DC | PRN

## 2017-08-08 MED ORDER — OXYCODONE HCL 5 MG PO TABS: 5.0000 mg | ORAL_TABLET | ORAL | Status: DC | PRN

## 2017-08-08 MED ORDER — OXYCODONE-ACETAMINOPHEN 5-325 MG PO TABS: 2.0000 | ORAL_TABLET | ORAL | Status: DC | PRN

## 2017-08-08 MED ORDER — METHYLERGONOVINE MALEATE 0.2 MG PO TABS: 0.2000 mg | ORAL_TABLET | ORAL | Status: DC | PRN

## 2017-08-08 MED ORDER — LACTATED RINGERS IV SOLN: 500.0000 mL | INTRAVENOUS | Status: DC | PRN

## 2017-08-08 MED ORDER — METHYLERGONOVINE MALEATE 0.2 MG/ML IJ SOLN: 0.2000 mg | INTRAMUSCULAR | Status: DC | PRN

## 2017-08-08 MED ORDER — LACTATED RINGERS IV SOLN
500.0000 mL | Freq: Once | INTRAVENOUS | Status: AC
Start: 2017-08-08 — End: 2017-08-08
  Administered 2017-08-08: 500 mL via INTRAVENOUS

## 2017-08-08 MED ORDER — ZOLPIDEM TARTRATE 5 MG PO TABS: 5.0000 mg | ORAL_TABLET | Freq: Every evening | ORAL | Status: DC | PRN

## 2017-08-08 MED ORDER — SENNOSIDES-DOCUSATE SODIUM 8.6-50 MG PO TABS
2.0000 | ORAL_TABLET | ORAL | Status: DC
  Administered 2017-08-08: 2 via ORAL
  Filled 2017-08-08: qty 2

## 2017-08-08 MED ORDER — ONDANSETRON HCL 4 MG/2ML IJ SOLN: 4.0000 mg | Freq: Four times a day (QID) | INTRAMUSCULAR | Status: DC | PRN

## 2017-08-08 MED ORDER — TERBUTALINE SULFATE 1 MG/ML IJ SOLN
0.2500 mg | Freq: Once | INTRAMUSCULAR | Status: DC | PRN
  Filled 2017-08-08: qty 1

## 2017-08-08 MED ORDER — WITCH HAZEL-GLYCERIN EX PADS: 1.0000 "application " | MEDICATED_PAD | CUTANEOUS | Status: DC | PRN

## 2017-08-08 MED ORDER — FENTANYL CITRATE (PF) 100 MCG/2ML IJ SOLN: 25.0000 ug | INTRAMUSCULAR | Status: DC | PRN

## 2017-08-08 MED ORDER — ONDANSETRON HCL 4 MG/2ML IJ SOLN: 4.0000 mg | INTRAMUSCULAR | Status: DC | PRN

## 2017-08-08 MED ORDER — PENICILLIN G POTASSIUM 5000000 UNITS IJ SOLR
5.0000 10*6.[IU] | Freq: Once | INTRAVENOUS | Status: AC
  Administered 2017-08-08: 5 10*6.[IU] via INTRAVENOUS
  Filled 2017-08-08: qty 5

## 2017-08-08 MED ORDER — SOD CITRATE-CITRIC ACID 500-334 MG/5ML PO SOLN
30.0000 mL | ORAL | Status: DC | PRN
Start: 2017-08-08 — End: 2017-08-08

## 2017-08-08 MED ORDER — FENTANYL 2.5 MCG/ML BUPIVACAINE 1/10 % EPIDURAL INFUSION (WH - ANES)
14.0000 mL/h | INTRAMUSCULAR | Status: DC | PRN
  Administered 2017-08-08: 14 mL/h via EPIDURAL
  Filled 2017-08-08: qty 100

## 2017-08-08 MED ORDER — DIBUCAINE 1 % RE OINT: 1.0000 "application " | TOPICAL_OINTMENT | RECTAL | Status: DC | PRN

## 2017-08-08 MED ORDER — BUTORPHANOL TARTRATE 1 MG/ML IJ SOLN: 1.0000 mg | INTRAMUSCULAR | Status: DC | PRN

## 2017-08-08 NOTE — Anesthesia Pain Management Evaluation Note (Signed)
  CRNA Pain Management Visit Note  Patient: Janet Li, 26 y.o., female  "Hello I am a member of the anesthesia team at [facility]. We have an anesthesia team available at all times to provide care throughout the hospital, including epidural management and anesthesia for C-section. I don't know your plan for the delivery whether it a natural birth, water birth, IV sedation, nitrous supplementation, doula or epidural, but we want to meet your pain goals."   1.Was your pain managed to your expectations on prior hospitalizations?   Yes   2.What is your expectation for pain management during this hospitalization?     Epidural  3.How can we help you reach that goal? unsure  Record the patient's initial score and the patient's pain goal.   Pain: 3  Pain Goal: 6 [facility] wants you to be able to say your pain was always managed very well.  Cephus ShellingBURGER,Janet Li 08/08/2017

## 2017-08-08 NOTE — Anesthesia Procedure Notes (Signed)
Epidural Patient location during procedure: OB Start time: 08/08/2017 12:05 PM End time: 08/08/2017 12:20 PM  Staffing Anesthesiologist: Bethena Midgetddono, Akina Maish, MD  Preanesthetic Checklist Completed: patient identified, site marked, surgical consent, pre-op evaluation, timeout performed, IV checked, risks and benefits discussed and monitors and equipment checked  Epidural Patient position: sitting Prep: site prepped and draped and DuraPrep Patient monitoring: continuous pulse ox and blood pressure Approach: midline Location: L4-L5 Injection technique: LOR air  Needle:  Needle type: Tuohy  Needle gauge: 17 G Needle length: 9 cm and 9 Needle insertion depth: 5 cm cm Catheter type: closed end flexible Catheter size: 19 Gauge Catheter at skin depth: 11 cm Test dose: negative  Assessment Events: blood not aspirated, injection not painful, no injection resistance, negative IV test and no paresthesia

## 2017-08-08 NOTE — Plan of Care (Signed)
Pt was able to ambulate to the bathroom and void with no problems.  Patient was encouraged and understands to have adequate nutrition to support breastfeeding and to give energy for caring for the baby.

## 2017-08-08 NOTE — H&P (Signed)
Janet Li is a 26 y.o. female, G3P1011, EGA 39+ weeks with EDC 12-24 presenting for elective induction.  Prenatal care essentially uncomplicated, received most of care in 1st [place] 2nd [place] and is going back soon.  OB History    Gravida Para Term Preterm AB Living   3 1 1   1 1    SAB TAB Ectopic Multiple Live Births   1     0 1     Past Medical History:  Diagnosis Date  . Hx of varicella   . Medical history non-contributory    Past Surgical History:  Procedure Laterality Date  . APPENDECTOMY     Family History: family history includes Hypertension in her father. Social History:  reports that  has never smoked. she has never used smokeless tobacco. She reports that she does not drink alcohol or use drugs.     Maternal Diabetes: No Genetic Screening: Declined Maternal Ultrasounds/Referrals: Normal Fetal Ultrasounds or other Referrals:  None Maternal Substance Abuse:  No Significant Maternal Medications:  None Significant Maternal Lab Results:  Lab values include: Group B Strep positive Other Comments:  None  Review of Systems  Respiratory: Negative.   Cardiovascular: Negative.    Maternal Medical History:  Contractions: Perceived severity is mild.    Fetal activity: Perceived fetal activity is normal.    Prenatal complications: no prenatal complications Prenatal Complications - Diabetes: none.    Dilation: 3 Effacement (%): 60 Station: -3 Exam by:: katherine g jones RN  Blood pressure 108/65, pulse 79, temperature (!) 97.5 F (36.4 C), temperature source Oral, resp. rate 20, height 5\' 4"  (1.626 m), weight 206 lb (93.4 kg), last menstrual period 11/06/2016, unknown if currently breastfeeding. Maternal Exam:  Uterine Assessment: Contraction strength is mild.  Contraction frequency is irregular.   Abdomen: Patient reports no abdominal tenderness. Estimated fetal weight is 7 1/2 lbs.   Fetal presentation: vertex  Introitus: Normal vulva. Normal  vagina.  Amniotic fluid character: not assessed.  Pelvis: adequate for delivery.   Cervix: Cervix evaluated by digital exam.     Fetal Exam Fetal Monitor Review: Mode: ultrasound.   Baseline rate: 120.  Variability: moderate (6-25 bpm).   Pattern: accelerations present and no decelerations.    Fetal State Assessment: Category I - tracings are normal.     Physical Exam  Vitals reviewed. Constitutional: She appears well-developed and well-nourished.  Cardiovascular: Normal rate and regular rhythm.  Respiratory: Effort normal. No respiratory distress.  GI: Soft.    Prenatal labs: ABO, Rh: O/Positive/-- (05/10 0000) Antibody: Negative (05/10 0000) Rubella: Immune (05/10 0000) RPR: Nonreactive (05/10 0000)  HBsAg: Negative (05/10 0000)  HIV: Non-reactive (05/10 0000)  GBS: Positive (05/10 0000)   Assessment/Plan: IUP at 39 weeks with favorable cervix for induction.  Pitocin just started, will monitor progress, anticipate SVD   Leighton Roachodd D Jozie Wulf 08/08/2017, 8:57 AM

## 2017-08-08 NOTE — Anesthesia Preprocedure Evaluation (Signed)

## 2017-08-08 NOTE — Progress Notes (Signed)
Comfortable with epidural Afeb, VSS FHT- Cat I, ctx q 2-3 min VE-6/50/-2, vtx, AROM clear Continue pitocin and PCN, monitor progress, anticipate SVD

## 2017-08-08 NOTE — Anesthesia Postprocedure Evaluation (Signed)
Anesthesia Post Note  Patient: Janet Li  Procedure(s) Performed: AN AD HOC LABOR EPIDURAL     Patient location during evaluation: Mother Baby Anesthesia Type: Epidural Level of consciousness: awake and alert Pain management: pain level controlled Vital Signs Assessment: post-procedure vital signs reviewed and stable Respiratory status: spontaneous breathing, nonlabored ventilation and respiratory function stable Cardiovascular status: stable Postop Assessment: no headache, no backache and epidural receding Anesthetic complications: no    Last Vitals:  Vitals:   08/08/17 1332 08/08/17 1415  BP: 111/72 (!) 113/58  Pulse: 72   Resp:    Temp:    SpO2:      Last Pain:  Vitals:   08/08/17 1412  TempSrc:   PainSc: 0-No pain   Pain Goal:                 Courvoisier Hamblen

## 2017-08-09 MED ORDER — IBUPROFEN 600 MG PO TABS: 600.0000 mg | ORAL_TABLET | Freq: Four times a day (QID) | ORAL | 1 refills | Status: DC | PRN

## 2017-08-09 MED ORDER — PRENATAL MULTIVITAMIN CH: 1.0000 | ORAL_TABLET | Freq: Every day | ORAL | 3 refills | Status: DC

## 2017-08-09 NOTE — Anesthesia Postprocedure Evaluation (Signed)
Anesthesia Post Note  Patient: Janet Li  Procedure(s) Performed: AN AD HOC LABOR EPIDURAL     Patient location during evaluation: Mother Baby Anesthesia Type: Epidural Level of consciousness: awake, awake and alert and oriented Pain management: pain level controlled Vital Signs Assessment: post-procedure vital signs reviewed and stable Respiratory status: spontaneous breathing, nonlabored ventilation and respiratory function stable Cardiovascular status: stable Postop Assessment: no headache, no backache, adequate PO intake, no apparent nausea or vomiting and patient able to bend at knees Anesthetic complications: no    Last Vitals:  Vitals:   08/08/17 2042 08/09/17 0538  BP: 99/63 (!) 96/52  Pulse: 75 81  Resp: 16 18  Temp: 37 C 36.6 C  SpO2:      Last Pain:  Vitals:   08/09/17 0847  TempSrc:   PainSc: 0-No pain   Pain Goal:                 Marsden Zaino

## 2017-08-09 NOTE — Discharge Summary (Signed)
OB Discharge Summary     Patient Name: Janet Li DOB: February 02, 1991 MRN: [id number]  Date of admission: 08/08/2017 Delivering MD: Jackelyn KnifeMEISINGER, TODD   Date of discharge: 08/09/2017  Admitting diagnosis: INDUCTION Intrauterine pregnancy: 3668w2d     Secondary diagnosis:  Active Problems:   Indication for care in labor or delivery   SVD (spontaneous vaginal delivery)  Additional problems: N/A     Discharge diagnosis: Term Pregnancy Delivered                                                                                                Post partum procedures:N/A  Augmentation: AROM and Pitocin  Complications: None  Hospital course:  Induction of Labor With Vaginal Delivery   26 y.o. yo [id number] at 4068w2d was admitted to the hospital 08/08/2017 for induction of labor.  Indication for induction: Favorable cervix at term.  Patient had an uncomplicated labor course as follows: Membrane Rupture Time/Date: 12:49 PM ,08/08/2017   Intrapartum Procedures: Episiotomy: None [1]                                         Lacerations:  2nd degree [3]  Patient had delivery of a Viable infant.  Information for the patient's newborn:  Antonieta Ibalvarez, Girl Sashay [[id number]][[id number]]  Delivery Method: Vaginal, Spontaneous(Filed from Delivery Summary)   08/08/2017  Details of delivery can be found in separate delivery note.  Patient had a routine postpartum course. Patient is discharged home 08/09/17.  Physical exam  Vitals:   08/08/17 1545 08/08/17 1655 08/08/17 2042 08/09/17 0538  BP: 113/64 (!) 108/52 99/63 (!) 96/52  Pulse: 72 72 75 81  Resp: 16 16 16 18   Temp: 97.6 F (36.4 C) 97.7 F (36.5 C) 98.6 F (37 C) 97.9 F (36.6 C)  TempSrc: Oral Oral Oral Oral  SpO2:      Weight:      Height:       General: alert and no distress Lochia: appropriate Uterine Fundus: firm  Labs: Lab Results  Component Value Date   WBC 10.5 08/08/2017   HGB 11.0 (L) 08/08/2017   HCT 33.8 (L) 08/08/2017   MCV 84.7  08/08/2017   PLT 194 08/08/2017   No flowsheet data found.  Discharge instruction: per After Visit Summary and "Baby and Me Booklet".  After visit meds:  Allergies as of 08/09/2017   No Known Allergies     Medication List    TAKE these medications   acetaminophen 325 MG tablet Commonly known as:  TYLENOL Take 650 mg by mouth every 6 (six) hours as needed for moderate pain.   ibuprofen 600 MG tablet Commonly known as:  ADVIL,MOTRIN Take 1 tablet (600 mg total) by mouth every 6 (six) hours as needed.   prenatal multivitamin Tabs tablet Take 1 tablet by mouth daily at 12 noon.       Diet: routine diet  Activity: Advance as tolerated. Pelvic rest for 6 weeks.   Outpatient follow up:6 weeks Follow up Appt:No  future appointments. Follow up Visit:No Follow-up on file.  Postpartum contraception: Undecided  Newborn Data: Live born female  Birth Weight: 8 lb 9.7 oz (3904 g) APGAR: 8, 9  Newborn Delivery   Birth date/time:  08/08/2017 14:12:00 Delivery type:  Vaginal, Spontaneous     Baby Feeding: Breast Disposition:home with mother   08/09/2017 Sherian ReinJody Bovard-Stuckert, MD

## 2017-08-09 NOTE — Addendum Note (Signed)
Addendum  created 08/09/17 [id number] by Glenn Gullickson, Doree Fudgeolleen S, CRNA   Charge Capture section accepted, Sign clinical note

## 2017-08-09 NOTE — Progress Notes (Addendum)
Post Partum Day 1 Subjective: no complaints, up ad lib, voiding, tolerating PO and nl lochai, pain controlled  Objective: Blood pressure (!) 96/52, pulse 81, temperature 97.9 F (36.6 C), temperature source Oral, resp. rate 18, height 5\' 4"  (1.626 m), weight 93.4 kg (206 lb), last menstrual period 11/06/2016, SpO2 97 %, unknown if currently breastfeeding.  Physical Exam:  General: alert and no distress Lochia: appropriate Uterine Fundus: firm   Recent Labs    08/08/17 0800  HGB 11.0*  HCT 33.8*    Assessment/Plan: Plan for discharge tomorrow, Breastfeeding and Lactation consult.  Routine PP care - desires d/c home.  Will do d/c at 24 hr if OK with peds.     LOS: 1 day   Janet Li 08/09/2017, 8:41 AM

## 2017-08-14 ENCOUNTER — Inpatient Hospital Stay (HOSPITAL_COMMUNITY)
Admission: AD | Admit: 2017-08-14 | Payer: BLUE CROSS/BLUE SHIELD | Source: Ambulatory Visit | Admitting: Obstetrics and Gynecology

## 2019-02-25 LAB — OB RESULTS CONSOLE RPR: RPR: NONREACTIVE

## 2019-02-25 LAB — OB RESULTS CONSOLE ABO/RH: RH Type: POSITIVE

## 2019-02-25 LAB — OB RESULTS CONSOLE ANTIBODY SCREEN: Antibody Screen: NEGATIVE

## 2019-02-25 LAB — OB RESULTS CONSOLE HEPATITIS B SURFACE ANTIGEN: Hepatitis B Surface Ag: NEGATIVE

## 2019-02-25 LAB — OB RESULTS CONSOLE RUBELLA ANTIBODY, IGM: Rubella: IMMUNE

## 2019-02-25 LAB — OB RESULTS CONSOLE HIV ANTIBODY (ROUTINE TESTING): HIV: NONREACTIVE

## 2019-08-09 ENCOUNTER — Emergency Department (HOSPITAL_COMMUNITY)
Admission: EM | Admit: 2019-08-09 | Discharge: 2019-08-09 | Disposition: A | Discharge status: Home / Self Care | Payer: BLUE CROSS/BLUE SHIELD | Attending: Emergency Medicine | Admitting: Emergency Medicine

## 2019-08-09 ENCOUNTER — Encounter (HOSPITAL_COMMUNITY): Payer: Self-pay | Admitting: Emergency Medicine

## 2019-08-09 ENCOUNTER — Other Ambulatory Visit: Payer: Self-pay

## 2019-08-09 DIAGNOSIS — R630 Anorexia: Secondary | ICD-10-CM | POA: Diagnosis not present

## 2019-08-09 DIAGNOSIS — U071 COVID-19: Secondary | ICD-10-CM | POA: Diagnosis not present

## 2019-08-09 DIAGNOSIS — R110 Nausea: Secondary | ICD-10-CM | POA: Diagnosis not present

## 2019-08-09 DIAGNOSIS — O2693 Pregnancy related conditions, unspecified, third trimester: Secondary | ICD-10-CM | POA: Insufficient documentation

## 2019-08-09 DIAGNOSIS — Z3493 Encounter for supervision of normal pregnancy, unspecified, third trimester: Secondary | ICD-10-CM

## 2019-08-09 DIAGNOSIS — Z79899 Other long term (current) drug therapy: Secondary | ICD-10-CM | POA: Insufficient documentation

## 2019-08-09 DIAGNOSIS — Z3A32 32 weeks gestation of pregnancy: Secondary | ICD-10-CM | POA: Insufficient documentation

## 2019-08-09 LAB — COMPREHENSIVE METABOLIC PANEL
ALT: 14 U/L (ref 0–44)
AST: 20 U/L (ref 15–41)
Albumin: 2.7 g/dL — ABNORMAL LOW (ref 3.5–5.0)
Alkaline Phosphatase: 91 U/L (ref 38–126)
Anion gap: 10 (ref 5–15)
BUN: <5 mg/dL — ABNORMAL LOW (ref 6–20)
CO2: 21 mmol/L — ABNORMAL LOW (ref 22–32)
Calcium: 8.3 mg/dL — ABNORMAL LOW (ref 8.9–10.3)
Chloride: 106 mmol/L (ref 98–111)
Creatinine, Ser: 0.55 mg/dL (ref 0.44–1.00)
GFR calc Af Amer: >60 mL/min (ref >60)
GFR calc non Af Amer: >60 mL/min (ref >60)
Glucose, Bld: 92 mg/dL (ref 70–99)
Potassium: 3.6 mmol/L (ref 3.5–5.1)
Sodium: 137 mmol/L (ref 135–145)
Total Bilirubin: 0.4 mg/dL (ref 0.3–1.2)
Total Protein: 6.2 g/dL — ABNORMAL LOW (ref 6.5–8.1)

## 2019-08-09 LAB — CBC WITH DIFFERENTIAL/PLATELET
Abs Immature Granulocytes: 0.17 10*3/uL — ABNORMAL HIGH (ref 0.00–0.07)
Basophils Absolute: 0 10*3/uL (ref 0.0–0.1)
Basophils Relative: 0 %
Eosinophils Absolute: 0 10*3/uL (ref 0.0–0.5)
Eosinophils Relative: 0 %
HCT: 35.3 % — ABNORMAL LOW (ref 36.0–46.0)
Hemoglobin: 11.8 g/dL — ABNORMAL LOW (ref 12.0–15.0)
Immature Granulocytes: 2 %
Lymphocytes Relative: 12 %
Lymphs Abs: 1 10*3/uL (ref 0.7–4.0)
MCH: 29.4 pg (ref 26.0–34.0)
MCHC: 33.4 g/dL (ref 30.0–36.0)
MCV: 87.8 fL (ref 80.0–100.0)
Monocytes Absolute: 0.5 10*3/uL (ref 0.1–1.0)
Monocytes Relative: 7 %
Neutro Abs: 6.1 10*3/uL (ref 1.7–7.7)
Neutrophils Relative %: 79 %
Platelets: 153 10*3/uL (ref 150–400)
RBC: 4.02 MIL/uL (ref 3.87–5.11)
RDW: 13.7 % (ref 11.5–15.5)
WBC: 7.7 10*3/uL (ref 4.0–10.5)
nRBC: 0 % (ref 0.0–0.2)

## 2019-08-09 LAB — TROPONIN I (HIGH SENSITIVITY): Troponin I (High Sensitivity): <2 ng/L (ref <18)

## 2019-08-09 MED ORDER — LACTATED RINGERS IV BOLUS
1000.0000 mL | Freq: Once | INTRAVENOUS | Status: AC
  Administered 2019-08-09: 1000 mL via INTRAVENOUS

## 2019-08-09 NOTE — ED Triage Notes (Signed)
Pt from home c/o cough, concern low BP, and nausea. Pt states 'haven't been able to [place] since Monday." 99/53 at urgent care. COVID + Monday   Pt is [redacted] weeks pregnant.

## 2019-08-09 NOTE — ED Provider Notes (Addendum)
[facility] EMERGENCY DEPARTMENT Provider Note   CSN: [id number] Arrival date & time: 08/09/19  2034     History Chief Complaint  Patient presents with  . Shortness of Breath    Aeriana Li is a 28 y.o. female G3P2 [redacted] weeks pregnant here presenting with nausea, low blood pressure. Patient was diagnosed with Covid 5 days ago. Since then she has poor appetite has not been eating drinking well . She also feels nauseated has some subjective shortness of breath . Denies any fevers at home. She went to urgent care was noted to have slightly low blood pressure of 99/53. She is sent here for IV fluids. Patient states that she felt the baby moving but denies any gush of fluid or actual contractions. Denies any vaginal bleeding.  The history is provided by the patient.       Past Medical History:  Diagnosis Date  . Hx of varicella   . Medical history non-contributory     Patient Active Problem List   Diagnosis Date Noted  . Indication for care in labor or delivery 08/08/2017  . SVD (spontaneous vaginal delivery) 08/08/2017  . Premature rupture of membranes 05/07/2016    Past Surgical History:  Procedure Laterality Date  . APPENDECTOMY       OB History    Gravida  4   Para  2   Term  2   Preterm      AB  1   Living  2     SAB  1   TAB      Ectopic      Multiple  0   Live Births  2           Family History  Problem Relation Age of Onset  . Hypertension Father     Social History   Tobacco Use  . Smoking status: Never Smoker  . Smokeless tobacco: Never Used  Substance Use Topics  . Alcohol use: No  . Drug use: No    Home Medications Prior to Admission medications   Medication Sig Start Date End Date Taking? Authorizing Provider  acetaminophen (TYLENOL) 325 MG tablet Take 650 mg by mouth every 6 (six) hours as needed for moderate pain.     [provider]  ibuprofen (ADVIL,MOTRIN) 600 MG tablet Take 1 tablet (600  mg total) by mouth every 6 (six) hours as needed. 08/09/17   Bovard-Stuckert, Jeral Fruit, MD  Prenatal Vit-Fe Fumarate-FA (PRENATAL MULTIVITAMIN) TABS tablet Take 1 tablet by mouth daily at 12 noon. 08/09/17   Bovard-Stuckert, Jeral Fruit, MD    Allergies    Patient has no known allergies.  Review of Systems   Review of Systems  Respiratory: Positive for shortness of breath.   All other systems reviewed and are negative.   Physical Exam Updated Vital Signs BP 113/64   Pulse (!) 109   Temp 98.9 F (37.2 C) (Oral)   Resp 17   Ht 5\' 4"  (1.626 m)   Wt 86.6 kg   SpO2 98%   BMI 32.79 kg/m   Physical Exam Vitals and nursing note reviewed.  HENT:     Head: Normocephalic.     Mouth/Throat:     Comments: MM slightly dry  Eyes:     Extraocular Movements: Extraocular movements intact.     Pupils: Pupils are equal, round, and reactive to light.  Cardiovascular:     Rate and Rhythm: Normal rate and regular rhythm.  Pulmonary:  Effort: Pulmonary effort is normal.     Breath sounds: Normal breath sounds.  Abdominal:     Palpations: Abdomen is soft.     Comments: Gravid uterus, nontender   Musculoskeletal:        General: Normal range of motion.     Cervical back: Normal range of motion.  Skin:    General: Skin is warm.     Capillary Refill: Capillary refill takes less than 2 seconds.  Neurological:     General: No focal deficit present.     Mental Status: She is alert and oriented to person, place, and time.  Psychiatric:        Mood and Affect: Mood normal.        Behavior: Behavior normal.     ED Results / Procedures / Treatments   Labs (all labs ordered are listed, but only abnormal results are displayed) Labs Reviewed  CBC WITH DIFFERENTIAL/PLATELET  COMPREHENSIVE METABOLIC PANEL  TROPONIN I (HIGH SENSITIVITY)    EKG EKG Interpretation  Date/Time:  Friday August 09 2019 21:13:10 EST Ventricular Rate:  96 PR Interval:    QRS Duration: 81 QT Interval:  330 QTC  Calculation: 417 R Axis:   103 Text Interpretation: Sinus rhythm Borderline short PR interval Borderline right axis deviation Nonspecific repol abnormality, inferior leads No previous ECGs available Confirmed by Richardean Canal [phone]) on 08/09/2019 9:17:46 PM   Radiology No results found.  Procedures Procedures (including critical care time)   Medications Ordered in ED Medications  lactated ringers bolus 1,000 mL (1,000 mLs Intravenous New Bag/Given 08/09/19 2126)    ED Course  I have reviewed the triage vital signs and the nursing notes.  Pertinent labs & imaging results that were available during my care of the patient were reviewed by me and considered in my medical decision making (see chart for details).    MDM Rules/Calculators/A&P                     [name] is a 28 y.o. female here with SOB. + COVID recently. She is [redacted] weeks pregnant. Not hypoxic or hypotensive in the ED. Will hydrate patient and call rapid OB to monitor for contractions.   10:40 PM OB monitoring showed no contractions and has normal FHR. Patient cleared by OB. Labs unremarkable. BP improved with IVF. Told her to quarantine at home and gave strict return precautions.   Janet Li was evaluated in Emergency Department on 08/09/2019 for the symptoms described in the history of present illness. She was evaluated in the context of the global COVID-19 pandemic, which necessitated consideration that the patient might be at risk for infection with the SARS-CoV-2 virus that causes COVID-19. Institutional protocols and algorithms that pertain to the evaluation of patients at risk for COVID-19 are in a state of rapid change based on information released by regulatory bodies including the CDC and federal and state organizations. These policies and algorithms were followed during the patient's care in the ED.   Final Clinical Impression(s) / ED Diagnoses Final diagnoses:  None    Rx / DC Orders ED  Discharge Orders    None       Charlynne Pander, MD 08/09/19 2242    Charlynne Pander, MD 08/09/19 2242

## 2019-08-09 NOTE — Progress Notes (Signed)
[facility] called to ED to assess COVID + patient, [redacted]w[redacted]d G4P2 presenting with concern of low BP and nausea.  BP 99/53 at home, "trying to eat and drink very little because of nausea."  Reports good fetal movement, some contractions yesterday & today - tightening but not painful, denies LOF & bleeding.  FHR baseline 150, moderate variability, 15x15 accels, no decels noted, 2 uc's in 25 minutes, mild by palpation, pain 0/10.  Dr. Melba Coon notified and patient OB cleared.

## 2019-08-09 NOTE — Discharge Instructions (Signed)
Stay hydrated   Take tylenol for headaches or fever   See your OB doctor   Return to ER if you have contractions, vaginal bleeding and discharge, fever.      Person Under Monitoring Name: Janet Li  Location: [address] [place] [address]   Infection Prevention Recommendations for Individuals Confirmed to have, or Being Evaluated for, 2019 Novel Coronavirus (COVID-19) Infection Who Receive Care at Home  Individuals who are confirmed to have, or are being evaluated for, COVID-19 should follow the prevention steps below until a healthcare provider or local or state health department says they can return to normal activities.  Stay home except to get medical care You should restrict activities outside your home, except for getting medical care. Do not go to work, school, or public areas, and do not use public transportation or taxis.  Call ahead before visiting your doctor Before your medical appointment, call the healthcare provider and tell them that you have, or are being evaluated for, COVID-19 infection. This will help the healthcare provider's office take steps to keep other people from getting infected. Ask your healthcare provider to call the local or state health department.  Monitor your symptoms Seek prompt medical attention if your illness is worsening (e.g., difficulty breathing). Before going to your medical appointment, call the healthcare provider and tell them that you have, or are being evaluated for, COVID-19 infection. Ask your healthcare provider to call the local or state health department.  Wear a facemask You should wear a facemask that covers your nose and mouth when you are in the same room with other people and when you visit a healthcare provider. People who live with or visit you should also wear a facemask while they are in the same room with you.  Separate yourself from other people in your home As much as possible, you should stay  in a different room from other people in your home. Also, you should use a separate bathroom, if available.  Avoid sharing household items You should not share dishes, drinking glasses, cups, eating utensils, towels, bedding, or other items with other people in your home. After using these items, you should wash them thoroughly with soap and water.  Cover your coughs and sneezes Cover your mouth and nose with a tissue when you cough or sneeze, or you can cough or sneeze into your sleeve. Throw used tissues in a lined trash can, and immediately wash your hands with soap and water for at least 20 seconds or use an alcohol-based hand rub.  Wash your [employer] your hands often and thoroughly with soap and water for at least 20 seconds. You can use an alcohol-based hand sanitizer if soap and water are not available and if your hands are not visibly dirty. Avoid touching your eyes, nose, and mouth with unwashed hands.   Prevention Steps for Caregivers and Household Members of Individuals Confirmed to have, or Being Evaluated for, COVID-19 Infection Being Cared for in the Home  If you live with, or provide care at home for, a person confirmed to have, or being evaluated for, COVID-19 infection please follow these guidelines to prevent infection:  Follow healthcare provider's instructions Make sure that you understand and can help the patient follow any healthcare provider instructions for all care.  Provide for the patient's basic needs You should help the patient with basic needs in the home and provide support for getting groceries, prescriptions, and other personal needs.  Monitor the patient's  symptoms If they are getting sicker, call his or her medical provider and tell them that the patient has, or is being evaluated for, COVID-19 infection. This will help the healthcare provider's office take steps to keep other people from getting infected. Ask the healthcare provider to call the  local or state health department.  Limit the number of people who have contact with the patient If possible, have only one caregiver for the patient. Other household members should stay in another home or place of residence. If this is not possible, they should stay in another room, or be separated from the patient as much as possible. Use a separate bathroom, if available. Restrict visitors who do not have an essential need to be in the home.  Keep older adults, very young children, and other sick people away from the patient Keep older adults, very young children, and those who have compromised immune systems or chronic health conditions away from the patient. This includes people with chronic heart, lung, or kidney conditions, diabetes, and cancer.  Ensure good ventilation Make sure that shared spaces in the home have good air flow, such as from an air conditioner or an opened window, weather permitting.  Wash your hands often Wash your hands often and thoroughly with soap and water for at least 20 seconds. You can use an alcohol based hand sanitizer if soap and water are not available and if your hands are not visibly dirty. Avoid touching your eyes, nose, and mouth with unwashed hands. Use disposable paper towels to dry your hands. If not available, use dedicated cloth towels and replace them when they become wet.  Wear a facemask and gloves Wear a disposable facemask at all times in the room and gloves when you touch or have contact with the patient's blood, body fluids, and/or secretions or excretions, such as sweat, saliva, sputum, nasal mucus, vomit, urine, or feces.  Ensure the mask fits over your nose and mouth tightly, and do not touch it during use. Throw out disposable facemasks and gloves after using them. Do not reuse. Wash your hands immediately after removing your facemask and gloves. If your personal clothing becomes contaminated, carefully remove clothing and launder. Wash  your hands after handling contaminated clothing. Place all used disposable facemasks, gloves, and other waste in a lined container before disposing them with other household waste. Remove gloves and wash your hands immediately after handling these items.  Do not share dishes, glasses, or other household items with the patient Avoid sharing household items. You should not share dishes, drinking glasses, cups, eating utensils, towels, bedding, or other items with a patient who is confirmed to have, or being evaluated for, COVID-19 infection. After the person uses these items, you should wash them thoroughly with soap and water.  Wash laundry thoroughly Immediately remove and wash clothes or bedding that have blood, body fluids, and/or secretions or excretions, such as sweat, saliva, sputum, nasal mucus, vomit, urine, or feces, on them. Wear gloves when handling laundry from the patient. Read and follow directions on labels of laundry or clothing items and detergent. In general, wash and dry with the warmest temperatures recommended on the label.  Clean all areas the individual has used often Clean all touchable surfaces, such as counters, tabletops, doorknobs, bathroom fixtures, toilets, phones, keyboards, tablets, and bedside tables, every day. Also, clean any surfaces that may have blood, body fluids, and/or secretions or excretions on them. Wear gloves when cleaning surfaces the patient has come in contact  with. Use a diluted bleach solution (e.g., dilute bleach with 1 part bleach and 10 parts water) or a household disinfectant with a label that says EPA-registered for coronaviruses. To make a bleach solution at home, add 1 tablespoon of bleach to 1 quart (4 cups) of water. For a larger supply, add  cup of bleach to 1 gallon (16 cups) of water. Read labels of cleaning products and follow recommendations provided on product labels. Labels contain instructions for safe and effective use of the  cleaning product including precautions you should take when applying the product, such as wearing gloves or eye protection and making sure you have good ventilation during use of the product. Remove gloves and wash hands immediately after cleaning.  Monitor yourself for signs and symptoms of illness Caregivers and household members are considered close contacts, should monitor their health, and will be asked to limit movement outside of the home to the extent possible. Follow the monitoring steps for close contacts listed on the symptom monitoring form.   ? If you have additional questions, contact your local health department or call the epidemiologist on call at [phone] (available 24/7). ? This guidance is subject to change. For the most up-to-date guidance from [facility], please refer to their website: [email or web address]

## 2019-08-09 NOTE — ED Notes (Signed)
Discharge instructions discussed with pt. Reinforced instructions given by OB nurse. Discussed COVID + quarantine. No questions at this time

## 2019-08-09 NOTE — ED Notes (Signed)
Rapid OB requested

## 2019-08-23 NOTE — L&D Delivery Note (Signed)
Delivery Note I arrived in room with patient c/c/+3 and urge to push.  She pushed x 3 and at 2:59 AM a healthy female was delivered via Vaginal, Spontaneous (Presentation:   Occiput Anterior).  APGAR: 9, 9; weight pending .   Placenta status: Spontaneous, Intact.  Cord: 3 vessels with the following complications: None.She had mild uterine atony following delivery of placenta that responded well to pitocin and methergine x 1.  Anesthesia: Local Episiotomy: None Lacerations: 2nd degree;Perineal Suture Repair: 3.0 vicryl Est. Blood Loss (mL):  Mom to postpartum.  Baby to Couplet care / Skin to Skin.  D/w pt circumcision and they decline  Oliver Pila 09/28/2019, 4:01 AM

## 2019-09-03 LAB — OB RESULTS CONSOLE GBS: GBS: NEGATIVE

## 2019-09-27 ENCOUNTER — Telehealth (HOSPITAL_COMMUNITY): Payer: Self-pay | Admitting: *Deleted

## 2019-09-27 ENCOUNTER — Encounter (HOSPITAL_COMMUNITY): Payer: Self-pay | Admitting: *Deleted

## 2019-09-27 NOTE — Telephone Encounter (Signed)
Preadmission screen  

## 2019-09-28 ENCOUNTER — Other Ambulatory Visit: Payer: Self-pay

## 2019-09-28 ENCOUNTER — Encounter (HOSPITAL_COMMUNITY): Payer: Self-pay | Admitting: Obstetrics and Gynecology

## 2019-09-28 ENCOUNTER — Inpatient Hospital Stay (HOSPITAL_COMMUNITY)
Admission: AD | Admit: 2019-09-28 | Discharge: 2019-09-29 | DRG: 807 | Disposition: A | Discharge status: Home / Self Care | Payer: BLUE CROSS/BLUE SHIELD | Attending: Obstetrics and Gynecology | Admitting: Obstetrics and Gynecology

## 2019-09-28 DIAGNOSIS — Z8616 Personal history of COVID-19: Secondary | ICD-10-CM | POA: Diagnosis not present

## 2019-09-28 DIAGNOSIS — Z3A39 39 weeks gestation of pregnancy: Secondary | ICD-10-CM

## 2019-09-28 DIAGNOSIS — O26893 Other specified pregnancy related conditions, third trimester: Secondary | ICD-10-CM | POA: Diagnosis present

## 2019-09-28 LAB — RESPIRATORY PANEL BY RT PCR (FLU A&B, COVID)
Influenza A by PCR: NEGATIVE
Influenza B by PCR: NEGATIVE
SARS Coronavirus 2 by RT PCR: POSITIVE — AB

## 2019-09-28 LAB — CBC
HCT: 37.6 % (ref 36.0–46.0)
Hemoglobin: 12.5 g/dL (ref 12.0–15.0)
MCH: 28.3 pg (ref 26.0–34.0)
MCHC: 33.2 g/dL (ref 30.0–36.0)
MCV: 85.3 fL (ref 80.0–100.0)
Platelets: 187 10*3/uL (ref 150–400)
RBC: 4.41 MIL/uL (ref 3.87–5.11)
RDW: 15.1 % (ref 11.5–15.5)
WBC: 12.3 10*3/uL — ABNORMAL HIGH (ref 4.0–10.5)
nRBC: 0 % (ref 0.0–0.2)

## 2019-09-28 LAB — TYPE AND SCREEN
ABO/RH(D): O POS
Antibody Screen: NEGATIVE

## 2019-09-28 LAB — RPR: RPR Ser Ql: NONREACTIVE

## 2019-09-28 LAB — ABO/RH: ABO/RH(D): O POS

## 2019-09-28 MED ORDER — PRENATAL MULTIVITAMIN CH
1.0000 | ORAL_TABLET | Freq: Every day | ORAL | Status: DC
  Administered 2019-09-28 – 2019-09-29 (×2): 1 via ORAL
  Filled 2019-09-28 (×2): qty 1

## 2019-09-28 MED ORDER — METHYLERGONOVINE MALEATE 0.2 MG/ML IJ SOLN
0.2000 mg | Freq: Once | INTRAMUSCULAR | Status: AC
  Administered 2019-09-28: 0.2 mg via INTRAMUSCULAR

## 2019-09-28 MED ORDER — LACTATED RINGERS IV SOLN: INTRAVENOUS | Status: DC

## 2019-09-28 MED ORDER — LACTATED RINGERS IV SOLN: 500.0000 mL | Freq: Once | INTRAVENOUS | Status: DC

## 2019-09-28 MED ORDER — LIDOCAINE HCL (PF) 1 % IJ SOLN
30.0000 mL | INTRAMUSCULAR | Status: DC | PRN
  Administered 2019-09-28: 30 mL via SUBCUTANEOUS
  Filled 2019-09-28: qty 30

## 2019-09-28 MED ORDER — BENZOCAINE-MENTHOL 20-0.5 % EX AERO
1.0000 "application " | INHALATION_SPRAY | CUTANEOUS | Status: DC | PRN
  Administered 2019-09-28: 1 via TOPICAL
  Filled 2019-09-28: qty 56

## 2019-09-28 MED ORDER — ONDANSETRON HCL 4 MG PO TABS: 4.0000 mg | ORAL_TABLET | ORAL | Status: DC | PRN

## 2019-09-28 MED ORDER — DIPHENHYDRAMINE HCL 50 MG/ML IJ SOLN: 12.5000 mg | INTRAMUSCULAR | Status: DC | PRN

## 2019-09-28 MED ORDER — DIPHENHYDRAMINE HCL 25 MG PO CAPS: 25.0000 mg | ORAL_CAPSULE | Freq: Four times a day (QID) | ORAL | Status: DC | PRN

## 2019-09-28 MED ORDER — SENNOSIDES-DOCUSATE SODIUM 8.6-50 MG PO TABS
2.0000 | ORAL_TABLET | ORAL | Status: DC
  Administered 2019-09-28: 2 via ORAL
  Filled 2019-09-28: qty 2

## 2019-09-28 MED ORDER — SIMETHICONE 80 MG PO CHEW: 80.0000 mg | CHEWABLE_TABLET | ORAL | Status: DC | PRN

## 2019-09-28 MED ORDER — PHENYLEPHRINE 40 MCG/ML (10ML) SYRINGE FOR IV PUSH (FOR BLOOD PRESSURE SUPPORT): 80.0000 ug | PREFILLED_SYRINGE | INTRAVENOUS | Status: DC | PRN

## 2019-09-28 MED ORDER — FLEET ENEMA 7-19 GM/118ML RE ENEM: 1.0000 | ENEMA | RECTAL | Status: DC | PRN

## 2019-09-28 MED ORDER — OXYCODONE-ACETAMINOPHEN 5-325 MG PO TABS: 2.0000 | ORAL_TABLET | ORAL | Status: DC | PRN

## 2019-09-28 MED ORDER — EPHEDRINE 5 MG/ML INJ: 10.0000 mg | INTRAVENOUS | Status: DC | PRN

## 2019-09-28 MED ORDER — OXYCODONE-ACETAMINOPHEN 5-325 MG PO TABS: 1.0000 | ORAL_TABLET | ORAL | Status: DC | PRN

## 2019-09-28 MED ORDER — METHYLERGONOVINE MALEATE 0.2 MG/ML IJ SOLN
INTRAMUSCULAR | Status: AC
  Filled 2019-09-28: qty 1

## 2019-09-28 MED ORDER — ACETAMINOPHEN 325 MG PO TABS: 650.0000 mg | ORAL_TABLET | ORAL | Status: DC | PRN

## 2019-09-28 MED ORDER — LACTATED RINGERS IV SOLN: 500.0000 mL | INTRAVENOUS | Status: DC | PRN

## 2019-09-28 MED ORDER — FENTANYL-BUPIVACAINE-NACL 0.5-0.125-0.9 MG/250ML-% EP SOLN
12.0000 mL/h | EPIDURAL | Status: DC | PRN
  Filled 2019-09-28: qty 250

## 2019-09-28 MED ORDER — COCONUT OIL OIL
1.0000 "application " | TOPICAL_OIL | Status: DC | PRN
  Administered 2019-09-28: 1 via TOPICAL

## 2019-09-28 MED ORDER — TETANUS-DIPHTH-ACELL PERTUSSIS 5-2.5-18.5 LF-MCG/0.5 IM SUSP: 0.5000 mL | Freq: Once | INTRAMUSCULAR | Status: DC

## 2019-09-28 MED ORDER — ZOLPIDEM TARTRATE 5 MG PO TABS: 5.0000 mg | ORAL_TABLET | Freq: Every evening | ORAL | Status: DC | PRN

## 2019-09-28 MED ORDER — WITCH HAZEL-GLYCERIN EX PADS: 1.0000 "application " | MEDICATED_PAD | CUTANEOUS | Status: DC | PRN

## 2019-09-28 MED ORDER — OXYTOCIN 40 UNITS IN NORMAL SALINE INFUSION - SIMPLE MED
2.5000 [IU]/h | INTRAVENOUS | Status: DC
  Filled 2019-09-28: qty 1000

## 2019-09-28 MED ORDER — DIBUCAINE (PERIANAL) 1 % EX OINT: 1.0000 "application " | TOPICAL_OINTMENT | CUTANEOUS | Status: DC | PRN

## 2019-09-28 MED ORDER — PHENYLEPHRINE 40 MCG/ML (10ML) SYRINGE FOR IV PUSH (FOR BLOOD PRESSURE SUPPORT)
80.0000 ug | PREFILLED_SYRINGE | INTRAVENOUS | Status: DC | PRN
  Filled 2019-09-28: qty 10

## 2019-09-28 MED ORDER — OXYTOCIN BOLUS FROM INFUSION
500.0000 mL | Freq: Once | INTRAVENOUS | Status: AC
  Administered 2019-09-28: 500 mL via INTRAVENOUS

## 2019-09-28 MED ORDER — SOD CITRATE-CITRIC ACID 500-334 MG/5ML PO SOLN: 30.0000 mL | ORAL | Status: DC | PRN

## 2019-09-28 MED ORDER — ONDANSETRON HCL 4 MG/2ML IJ SOLN: 4.0000 mg | Freq: Four times a day (QID) | INTRAMUSCULAR | Status: DC | PRN

## 2019-09-28 MED ORDER — IBUPROFEN 600 MG PO TABS
600.0000 mg | ORAL_TABLET | Freq: Four times a day (QID) | ORAL | Status: DC
  Administered 2019-09-28 – 2019-09-29 (×6): 600 mg via ORAL
  Filled 2019-09-28 (×6): qty 1

## 2019-09-28 MED ORDER — ACETAMINOPHEN 325 MG PO TABS
650.0000 mg | ORAL_TABLET | ORAL | Status: DC | PRN
  Administered 2019-09-28: 650 mg via ORAL
  Filled 2019-09-28: qty 2

## 2019-09-28 MED ORDER — ONDANSETRON HCL 4 MG/2ML IJ SOLN: 4.0000 mg | INTRAMUSCULAR | Status: DC | PRN

## 2019-09-28 NOTE — H&P (Signed)
Janet Li is a 29 y.o. female [id number] at 8 3/7 weeks (EDD 10/02/19 by LMP c/w 8 week [place])  Presented in active labor at 6cm in MAU and 10cm on arrival to L&D with urge to push. Prenatal care uncomplicated except had COVID 08/05/19.  She received initial prenatal care in [place] and transferred to our practice at 27 weeks.  OB History    Gravida  4   Para  2   Term  2   Preterm      AB  1   Living  2     SAB  1   TAB      Ectopic      Multiple  0   Live Births  2         05-08-2016, 40 wks  F, 7lbs 7oz, NSVD  08-08-2017, 39.1 wks F, 8lbs 9oz, NSVD  Past Medical History:  Diagnosis Date  . Hx of varicella   . Medical history non-contributory    Past Surgical History:  Procedure Laterality Date  . APPENDECTOMY     Family History: family history includes Hypertension in her father. Social History:  reports that she has never smoked. She has never used smokeless tobacco. She reports that she does not drink alcohol or use drugs.     Maternal Diabetes: No Genetic Screening: Declined Maternal Ultrasounds/Referrals: Normal Fetal Ultrasounds or other Referrals:  None Maternal Substance Abuse:  No Significant Maternal Medications:  None Significant Maternal Lab Results:  Group B Strep negative Other Comments:  None  Review of Systems  Constitutional: Negative for fever.  Gastrointestinal: Positive for abdominal pain.   Maternal Medical History:  Reason for admission: Contractions.   Contractions: Onset was 1-2 hours ago.   Frequency: regular.   Perceived severity is strong.    Fetal activity: Perceived fetal activity is normal.    Prenatal Complications - Diabetes: none.    Dilation: 10 Effacement (%): 90 Station: Plus 2 Exam by:: Mickle Mallory, RN Blood pressure 116/73, pulse 96, resp. rate 18, unknown if currently breastfeeding. Maternal Exam:  Uterine Assessment: Contraction strength is firm.  Contraction frequency is regular.    Abdomen: Patient reports no abdominal tenderness. Fetal presentation: vertex  Introitus: Normal vulva. Normal vagina.    Physical Exam  Constitutional: She appears well-developed.  Cardiovascular: Normal rate and regular rhythm.  Respiratory: Effort normal.  GI: Soft.  Genitourinary:    Vulva and vagina normal.   Musculoskeletal:        General: Normal range of motion.  Neurological: She is alert.  Psychiatric: She has a normal mood and affect.    Prenatal labs: ABO, Rh: --/--/O POS, O POS Performed at [facility], [address] [place], [place] [address]  [phone]) Antibody: NEG (02/06 0245) Rubella: Immune (07/06 0000) RPR: Nonreactive (07/06 0000)  HBsAg: Negative (07/06 0000)  HIV: Non-reactive (07/06 0000)  GBS: Negative/-- (01/12 0000)  One hour GCT 110 Essential panel negative last pregnancy  Assessment/Plan: Pt arrived to L&D c/c/+3 and urge to push.  See delivery note   Oliver Pila 09/28/2019, 3:46 AM

## 2019-09-28 NOTE — Progress Notes (Signed)
Patient ID: Janet Li, female   DOB: Mar 08, 1991, 29 y.o.   MRN: [id number] POD 0 Doing fine, got a little nap. Bleeding normal Baby latching well per pt. Continue care

## 2019-09-28 NOTE — Lactation Note (Signed)
This note was copied from a baby's chart. Lactation Consultation Note  Patient Name: Janet Li Date: 09/28/2019   Initial visit at 7 hours of life. Mom is a P3 (her 2 older children are 2 & 27yo). Mom nursed her 1st child for 3 weeks & her 2nd child for 5-6 mo. This infant is DAT +. Mom's 1st child required phototherapy. Mom had a good supply with her 2nd child. Mom reported that she could pump 4 oz after feedings with her 2nd child.   Mother had questions about feeding on both sides, LC student provided education about hunger cues, importance of skin to skin, and normal newborn behavior.  Mom has a [employer] pump at home. Breastfeeding info brochure provided.  Signed by Lilla Shook Janee Morn, BA, CLC   I concur with the above. In light of infant being DAT + & the oldest sibling requiring phototherapy, I offered to return if Mom needed any help with ensuring intake (whether it's assisting with latching or hand-expressing and spoon feeding). Mom knows she can call us to return, if she desires.   Lurline Hare [facility] 09/28/2019, 10:29 AM

## 2019-09-28 NOTE — MAU Note (Signed)
Pt here with contractions every couple of minutes apart. Denies LOF. Has some bloody show. Was 3cm 2 days ago. Good fetal movement. Reports history of rapid delivery with last baby.

## 2019-09-28 NOTE — Plan of Care (Signed)

## 2019-09-29 MED ORDER — IBUPROFEN 600 MG PO TABS: 600.0000 mg | ORAL_TABLET | Freq: Four times a day (QID) | ORAL | 0 refills | Status: DC

## 2019-09-29 NOTE — Discharge Summary (Signed)
OB Discharge Summary     Patient Name: Janet Li DOB: 04-11-1991 MRN: [id number]  Date of admission: 09/28/2019 Delivering MD: Huel Cote   Date of discharge: 09/29/2019  Admitting diagnosis: Normal labor [O80, Z37.9] Precipitous delivery, delivered (current hospitalization) [O62.3] Intrauterine pregnancy: [redacted]w[redacted]d     Secondary diagnosis:  Active Problems:   Normal labor   Precipitous delivery, delivered (current hospitalization)  Additional problems: none     Discharge diagnosis: Term Pregnancy Delivered                                                                                                Post partum procedures:none  Complications: None  Hospital course:  Onset of Labor With Vaginal Delivery     29 y.o. yo [phone] at [redacted]w[redacted]d was admitted in Active Labor on 09/28/2019. Patient had an uncomplicated labor course as follows:  Membrane Rupture Time/Date: 2:50 AM ,09/28/2019   Intrapartum Procedures: Episiotomy: None [1]                                         Lacerations:  2nd degree [3];Perineal [11]  Patient had a delivery of a Viable infant. 09/28/2019  Information for the patient's newborn:  Alleyah, Twombly [[id number]]  Delivery Method: Vaginal, Spontaneous(Filed from Delivery Summary)     Pateint had an uncomplicated postpartum course.  She is ambulating, tolerating a regular diet, passing flatus, and urinating well. Patient is discharged home in stable condition on 09/29/19.   Physical exam  Vitals:   09/28/19 1415 09/28/19 1751 09/28/19 2152 09/29/19 0506  BP: (!) 112/53 (!) 108/56 (!) 105/57 (!) 98/56  Pulse: 83 89 83 86  Resp: 16 16 16 16   Temp: 98.6 F (37 C) 98.9 F (37.2 C) 98.1 F (36.7 C) 98.1 F (36.7 C)  TempSrc: Oral Oral Oral Oral  SpO2: 99% 100%  100%  Weight:      Height:       General: alert and cooperative Lochia: appropriate Uterine Fundus: firm  Labs: Lab Results  Component Value Date   WBC 12.3 (H) 09/28/2019   HGB 12.5  09/28/2019   HCT 37.6 09/28/2019   MCV 85.3 09/28/2019   PLT 187 09/28/2019   CMP Latest Ref Rng & Units 08/09/2019  Glucose 70 - 99 mg/dL 92  BUN 6 - 20 mg/dL 08/11/2019)  Creatinine <2(G - 1.00 mg/dL 2.54  Sodium 2.70 - 623 mmol/L 137  Potassium 3.5 - 5.1 mmol/L 3.6  Chloride 98 - 111 mmol/L 106  CO2 22 - 32 mmol/L 21(L)  Calcium 8.9 - 10.3 mg/dL 8.3(L)  Total Protein 6.5 - 8.1 g/dL 6.2(L)  Total Bilirubin 0.3 - 1.2 mg/dL 0.4  Alkaline Phos 38 - 126 U/L 91  AST 15 - 41 U/L 20  ALT 0 - 44 U/L 14    Discharge instruction: per After Visit Summary and "Baby and Me Booklet".  After visit meds:  Allergies as of 09/29/2019   No Known Allergies     Medication  List    TAKE these medications   acetaminophen 325 MG tablet Commonly known as: TYLENOL Take 650 mg by mouth every 6 (six) hours as needed for moderate pain.   ibuprofen 600 MG tablet Commonly known as: ADVIL Take 1 tablet (600 mg total) by mouth every 6 (six) hours. What changed:   when to take this  reasons to take this   prenatal multivitamin Tabs tablet Take 1 tablet by mouth daily at 12 noon.       Diet: routine diet  Activity: Advance as tolerated. Pelvic rest for 6 weeks.   Outpatient follow up:6 weeks Follow up Appt: Future Appointments  Date Time Provider [place]  10/02/2019  9:10 AM [facility] None   Follow up Visit:No follow-ups on file.  Postpartum contraception: Progesterone only pills and Undecided  Newborn Data: Live born female  Birth Weight: 8 lb 2.3 oz (3694 g) APGAR: 60, 9  Newborn Delivery   Birth date/time: 09/28/2019 02:59:00 Delivery type: Vaginal, Spontaneous      Baby Feeding: Breast Disposition:home with mother   09/29/2019 Logan Bores, MD

## 2019-09-29 NOTE — Lactation Note (Signed)
This note was copied from a baby's chart. Lactation Consultation Note  Patient Name: Janet Li QTTCN'[id number] Date: 09/29/2019 Reason for consult: Follow-up assessment Mom and baby will be discharged this morning.  Discussed milk coming to volume and the prevention and treatment of engorgement.  Mom is ready to put baby to breast and would like assist.  Assisted mom with correct positioning for football hold.  Baby opened wide and latched easily and well.  Instructed on pulling down on baby's chin to flange lip.  Baby fed well with good swallows noted.  Mom states this is much more comfortable.  Questions answered.  Maternal Data    Feeding Feeding Type: Breast Fed  LATCH Score Latch: Grasps breast easily, tongue down, lips flanged, rhythmical sucking.  Audible Swallowing: Spontaneous and intermittent  Type of Nipple: Everted at rest and after stimulation  Comfort (Breast/Nipple): Soft / non-tender  Hold (Positioning): Assistance needed to correctly position infant at breast and maintain latch.  LATCH Score: 9  Interventions Interventions: Assisted with latch;Adjust position;Support pillows  Lactation Tools Discussed/Used     Consult Status Consult Status: Complete Date: 09/30/19 Follow-up type: Call as needed    Huston Foley 09/29/2019, 11:27 AM

## 2019-09-29 NOTE — Progress Notes (Signed)
Post Partum Day 1 Subjective: no complaints, up ad lib and tolerating PO  Objective: Blood pressure (!) 98/56, pulse 86, temperature 98.1 F (36.7 C), temperature source Oral, resp. rate 16, height 5\' 4"  (1.626 m), weight 90.7 kg, SpO2 100 %, unknown if currently breastfeeding.  Physical Exam:  General: alert and cooperative Lochia: appropriate Uterine Fundus: firm   Recent Labs    09/28/19 0213  HGB 12.5  HCT 37.6    Assessment/Plan: Discharge home Ibuprofen for pain  LOS: 1 day   11/26/19 09/29/2019, 11:05 AM

## 2019-09-29 NOTE — Lactation Note (Signed)
This note was copied from a baby's chart. Lactation Consultation Note  Patient Name: Janet Li QASTM'H Date: 09/29/2019 Reason for consult: Follow-up assessment;Term Baby is 31 hours old/6% weight loss.  Mom feels breastfeeding is going well.  She is still working on baby opening wide prior to latch.  Recommended getting baby undressed and awake.  Encouraged to call for assist prn.  Maternal Data    Feeding Feeding Type: Breast Fed  LATCH Score                   Interventions    Lactation Tools Discussed/Used     Consult Status Consult Status: Follow-up Date: 09/30/19 Follow-up type: In-patient    Huston Foley 09/29/2019, 10:29 AM

## 2019-10-02 ENCOUNTER — Other Ambulatory Visit (HOSPITAL_COMMUNITY): Payer: BLUE CROSS/BLUE SHIELD

## 2019-10-04 ENCOUNTER — Inpatient Hospital Stay (HOSPITAL_COMMUNITY)
Admission: AD | Admit: 2019-10-04 | Payer: BLUE CROSS/BLUE SHIELD | Source: Home / Self Care | Admitting: Obstetrics and Gynecology

## 2019-10-04 ENCOUNTER — Inpatient Hospital Stay (HOSPITAL_COMMUNITY): Payer: BLUE CROSS/BLUE SHIELD

## 2021-05-21 ENCOUNTER — Emergency Department (HOSPITAL_COMMUNITY)
Admission: EM | Admit: 2021-05-21 | Discharge: 2021-05-22 | Disposition: A | Discharge status: Home / Self Care | Payer: Medicaid Other | Attending: Emergency Medicine | Admitting: Emergency Medicine

## 2021-05-21 ENCOUNTER — Other Ambulatory Visit: Payer: Self-pay

## 2021-05-21 ENCOUNTER — Encounter (HOSPITAL_COMMUNITY): Payer: Self-pay | Admitting: *Deleted

## 2021-05-21 ENCOUNTER — Emergency Department (HOSPITAL_COMMUNITY): Payer: Medicaid Other

## 2021-05-21 DIAGNOSIS — X58XXXA Exposure to other specified factors, initial encounter: Secondary | ICD-10-CM | POA: Diagnosis not present

## 2021-05-21 DIAGNOSIS — M791 Myalgia, unspecified site: Secondary | ICD-10-CM | POA: Insufficient documentation

## 2021-05-21 DIAGNOSIS — S199XXA Unspecified injury of neck, initial encounter: Secondary | ICD-10-CM | POA: Diagnosis present

## 2021-05-21 DIAGNOSIS — S161XXA Strain of muscle, fascia and tendon at neck level, initial encounter: Secondary | ICD-10-CM | POA: Diagnosis not present

## 2021-05-21 DIAGNOSIS — M5412 Radiculopathy, cervical region: Secondary | ICD-10-CM | POA: Insufficient documentation

## 2021-05-21 LAB — I-STAT CHEM 8, ED
BUN: 17 mg/dL (ref 6–20)
Calcium, Ion: 1.1 mmol/L — ABNORMAL LOW (ref 1.15–1.40)
Chloride: 109 mmol/L (ref 98–111)
Creatinine, Ser: 0.5 mg/dL (ref 0.44–1.00)
Glucose, Bld: 91 mg/dL (ref 70–99)
HCT: 37 % (ref 36.0–46.0)
Hemoglobin: 12.6 g/dL (ref 12.0–15.0)
Potassium: 3.6 mmol/L (ref 3.5–5.1)
Sodium: 139 mmol/L (ref 135–145)
TCO2: 22 mmol/L (ref 22–32)

## 2021-05-21 LAB — I-STAT BETA HCG BLOOD, ED (MC, WL, AP ONLY): I-stat hCG, quantitative: <5 m[IU]/mL (ref <5)

## 2021-05-21 MED ORDER — OXYCODONE-ACETAMINOPHEN 5-325 MG PO TABS
1.0000 | ORAL_TABLET | Freq: Once | ORAL | Status: AC
  Administered 2021-05-21: 1 via ORAL
  Filled 2021-05-21: qty 1

## 2021-05-21 NOTE — ED Provider Notes (Signed)
Emergency Medicine Provider Triage Evaluation Note  Janet Li , a 30 y.o. female  was evaluated in triage.  Pt complains of pain to the neck and intermittent left arm numbness. Pain has been present for about  week. [facility] and was told she needed a ct of the cervical spine.  Review of Systems  Positive: Neck pain, arm tingling Negative: incontinence  Physical Exam  BP 109/65 (BP Location: Left Arm)   Pulse 78   Temp 98.4 F (36.9 C) (Oral)   Resp 13   SpO2 98%  Gen:   Awake, no distress   Resp:  Normal effort  MSK:   Moves extremities without difficulty  Other:  In ccollar  Medical Decision Making  Medically screening exam initiated at 7:34 PM.  Appropriate orders placed.  Janet Li was informed that the remainder of the evaluation will be completed by another provider, this initial triage assessment does not replace that evaluation, and the importance of remaining in the ED until their evaluation is complete.     Karrie Meres, PA-C 05/21/21 1935    Virgina Norfolk, DO 05/21/21 2032

## 2021-05-21 NOTE — ED Triage Notes (Signed)
Neck pain for 2 weeks she saw a doctor today who told her she had a fracture in her neck  and he told her to come to the ed to have a c-t of her neck

## 2021-05-22 ENCOUNTER — Emergency Department (HOSPITAL_COMMUNITY): Payer: Medicaid Other

## 2021-05-22 MED ORDER — KETOROLAC TROMETHAMINE 15 MG/ML IJ SOLN
15.0000 mg | Freq: Once | INTRAMUSCULAR | Status: AC
  Administered 2021-05-22: 15 mg via INTRAVENOUS
  Filled 2021-05-22: qty 1

## 2021-05-22 MED ORDER — IOHEXOL 350 MG/ML SOLN
100.0000 mL | Freq: Once | INTRAVENOUS | Status: AC | PRN
  Administered 2021-05-22: 100 mL via INTRAVENOUS

## 2021-05-22 NOTE — Discharge Instructions (Addendum)
Take 4 over the counter ibuprofen tablets 3 times a day or 2 over-the-counter naproxen tablets twice a day for pain. Also take tylenol 1000mg (2 extra strength) four times a day.   Follow up with your PCP and neurology in the office.

## 2021-05-22 NOTE — ED Provider Notes (Signed)
[facility] EMERGENCY DEPARTMENT Provider Note   CSN: [id number] Arrival date & time: 05/21/21  1919     History Chief Complaint  Patient presents with   Neck Pain    Janet Li is a 30 y.o. female.  30 yo F with a cc of neck pain and numbness and weakness to the left arm.  This comes in waves and feels a bit warm and then resolves in seconds.  She started having this discomfort after seeing a chiropractor earlier in the week.  Went to the orthopedic office today and had a plain film that was concerning for a possible fracture and was sent here for CT scan.  The patient denies any recent trauma.  The history is provided by the patient.  Neck Pain Associated symptoms: weakness   Associated symptoms: no chest pain, no fever and no headaches   Illness Severity:  Moderate Onset quality:  Gradual Duration:  3 hours Timing:  Constant Progression:  Worsening Chronicity:  New Associated symptoms: myalgias   Associated symptoms: no chest pain, no congestion, no fever, no headaches, no nausea, no rhinorrhea, no shortness of breath, no vomiting and no wheezing       Past Medical History:  Diagnosis Date   Hx of varicella    Medical history non-contributory     Patient Active Problem List   Diagnosis Date Noted   Normal labor 09/28/2019   Precipitous delivery, delivered (current hospitalization) 09/28/2019   Indication for care in labor or delivery 08/08/2017   SVD (spontaneous vaginal delivery) 08/08/2017   Premature rupture of membranes 05/07/2016    Past Surgical History:  Procedure Laterality Date   APPENDECTOMY       OB History     Gravida  4   Para  3   Term  3   Preterm      AB  1   Living  3      SAB  1   IAB      Ectopic      Multiple  0   Live Births  3           Family History  Problem Relation Age of Onset   Hypertension Father     Social History   Tobacco Use   Smoking status: Never   Smokeless  tobacco: Never  Substance Use Topics   Alcohol use: No   Drug use: No    Home Medications Prior to Admission medications   Medication Sig Start Date End Date Taking? Authorizing Provider  acetaminophen (TYLENOL) 500 MG tablet Take 500-1,000 mg by mouth every 6 (six) hours as needed for moderate pain or headache.   Yes [provider]  Multiple Vitamin (MULTIVITAMIN) tablet Take 1 tablet by mouth daily.   Yes [provider]  ibuprofen (ADVIL) 600 MG tablet Take 1 tablet (600 mg total) by mouth every 6 (six) hours. Patient not taking: Reported on 05/22/2021 09/29/19   Huel Cote, MD  Prenatal Vit-Fe Fumarate-FA (PRENATAL MULTIVITAMIN) TABS tablet Take 1 tablet by mouth daily at 12 noon. Patient not taking: Reported on 05/22/2021 08/09/17   Sherian Rein, MD    Allergies    Patient has no known allergies.  Review of Systems   Review of Systems  Constitutional:  Negative for chills and fever.  HENT:  Negative for congestion and rhinorrhea.   Eyes:  Negative for redness and visual disturbance.  Respiratory:  Negative for shortness of breath and wheezing.  Cardiovascular:  Negative for chest pain and palpitations.  Gastrointestinal:  Negative for nausea and vomiting.  Genitourinary:  Negative for dysuria and urgency.  Musculoskeletal:  Positive for arthralgias, myalgias and neck pain.  Skin:  Negative for pallor and wound.  Neurological:  Positive for weakness. Negative for dizziness and headaches.   Physical Exam Updated Vital Signs BP (!) 96/58 (BP Location: Left Arm)   Pulse (!) 58   Temp 98.8 F (37.1 C) (Oral)   Resp 14   Ht 5\' 4"  (1.626 m)   Wt 90.7 kg   LMP 05/04/2021   SpO2 100%   BMI 34.32 kg/m   Physical Exam Vitals and nursing note reviewed.  Constitutional:      General: She is not in acute distress.    Appearance: She is well-developed. She is not diaphoretic.  HENT:     Head: Normocephalic and atraumatic.  Eyes:     Pupils:  Pupils are equal, round, and reactive to light.  Cardiovascular:     Rate and Rhythm: Normal rate and regular rhythm.     Heart sounds: No murmur heard.   No friction rub. No gallop.  Pulmonary:     Effort: Pulmonary effort is normal.     Breath sounds: No wheezing or rales.  Abdominal:     General: There is no distension.     Palpations: Abdomen is soft.     Tenderness: There is no abdominal tenderness.  Musculoskeletal:        General: Tenderness present.     Cervical back: Normal range of motion and neck supple.     Comments: Tenderness to the perimuscular sure of the C-spine.  Worse on the right than the left.  Some pain and spasm to the trapezius bilaterally.  Pulse motor and sensation intact bilaterally.  No appreciable weakness on exam.  Negative Spurling's test bilaterally.  Skin:    General: Skin is warm and dry.  Neurological:     Mental Status: She is alert and oriented to person, place, and time.  Psychiatric:        Behavior: Behavior normal.    ED Results / Procedures / Treatments   Labs (all labs ordered are listed, but only abnormal results are displayed) Labs Reviewed  I-STAT CHEM 8, ED - Abnormal; Notable for the following components:      Result Value   Calcium, Ion 1.10 (*)    All other components within normal limits  I-STAT BETA HCG BLOOD, ED ([facility], [facility], [facility] ONLY)    EKG None  Radiology CT Angio Head W or Wo Contrast  Result Date: 05/22/2021 CLINICAL DATA:  Possible carotid artery dissection. EXAM: CT ANGIOGRAPHY HEAD AND NECK TECHNIQUE: Multidetector CT imaging of the head and neck was performed using the standard protocol during bolus administration of intravenous contrast. Multiplanar CT image reconstructions and MIPs were obtained to evaluate the vascular anatomy. Carotid stenosis measurements (when applicable) are obtained utilizing NASCET criteria, using the distal internal carotid diameter as the denominator. CONTRAST:  07/22/2021 OMNIPAQUE IOHEXOL 350  MG/ML SOLN COMPARISON:  None. FINDINGS: CT HEAD FINDINGS Brain: There is no mass, hemorrhage or extra-axial collection. The size and configuration of the ventricles and extra-axial CSF spaces are normal. There is no acute or chronic infarction. The brain parenchyma is normal. Skull: The visualized skull base, calvarium and extracranial soft tissues are normal. Sinuses/Orbits: No fluid levels or advanced mucosal thickening of the visualized paranasal sinuses. No mastoid or middle ear effusion. The orbits are  normal. CTA NECK FINDINGS SKELETON: There is no bony spinal canal stenosis. No lytic or blastic lesion. OTHER NECK: Normal pharynx, larynx and major salivary glands. No cervical lymphadenopathy. Unremarkable thyroid gland. UPPER CHEST: No pneumothorax or pleural effusion. No nodules or masses. AORTIC ARCH: There is no calcific atherosclerosis of the aortic arch. There is no aneurysm, dissection or hemodynamically significant stenosis of the visualized portion of the aorta. Conventional 3 vessel aortic branching pattern. The visualized proximal subclavian arteries are widely patent. RIGHT CAROTID SYSTEM: Normal without aneurysm, dissection or stenosis. LEFT CAROTID SYSTEM: Normal without aneurysm, dissection or stenosis. VERTEBRAL ARTERIES: Left dominant configuration. Both origins are clearly patent. There is no dissection, occlusion or flow-limiting stenosis to the skull base (V1-V3 segments). CTA HEAD FINDINGS POSTERIOR CIRCULATION: --Vertebral arteries: Normal V4 segments. --Inferior cerebellar arteries: Normal. --Basilar artery: Normal. --Superior cerebellar arteries: Normal. --Posterior cerebral arteries (PCA): Normal. ANTERIOR CIRCULATION: --Intracranial internal carotid arteries: Normal. --Anterior cerebral arteries (ACA): Normal. Both A1 segments are present. Patent anterior communicating artery (a-comm). --Middle cerebral arteries (MCA): Normal. VENOUS SINUSES: As permitted by contrast timing, patent.  ANATOMIC VARIANTS: None Review of the MIP images confirms the above findings. IMPRESSION: Normal CTA of the head and neck. Electronically Signed   By: Deatra Robinson M.D.   On: 05/22/2021 02:24   CT Angio Neck W and/or Wo Contrast  Result Date: 05/22/2021 CLINICAL DATA:  Possible carotid artery dissection. EXAM: CT ANGIOGRAPHY HEAD AND NECK TECHNIQUE: Multidetector CT imaging of the head and neck was performed using the standard protocol during bolus administration of intravenous contrast. Multiplanar CT image reconstructions and MIPs were obtained to evaluate the vascular anatomy. Carotid stenosis measurements (when applicable) are obtained utilizing NASCET criteria, using the distal internal carotid diameter as the denominator. CONTRAST:  OMNIPAQUE IOHEXOL 350 MG/ML SOLN COMPARISON:  None. FINDINGS: CT HEAD FINDINGS Brain: There is no mass, hemorrhage or extra-axial collection. The size and configuration of the ventricles and extra-axial CSF spaces are normal. There is no acute or chronic infarction. The brain parenchyma is normal. Skull: The visualized skull base, calvarium and extracranial soft tissues are normal. Sinuses/Orbits: No fluid levels or advanced mucosal thickening of the visualized paranasal sinuses. No mastoid or middle ear effusion. The orbits are normal. CTA NECK FINDINGS SKELETON: There is no bony spinal canal stenosis. No lytic or blastic lesion. OTHER NECK: Normal pharynx, larynx and major salivary glands. No cervical lymphadenopathy. Unremarkable thyroid gland. UPPER CHEST: No pneumothorax or pleural effusion. No nodules or masses. AORTIC ARCH: There is no calcific atherosclerosis of the aortic arch. There is no aneurysm, dissection or hemodynamically significant stenosis of the visualized portion of the aorta. Conventional 3 vessel aortic branching pattern. The visualized proximal subclavian arteries are widely patent. RIGHT CAROTID SYSTEM: Normal without aneurysm, dissection or  stenosis. LEFT CAROTID SYSTEM: Normal without aneurysm, dissection or stenosis. VERTEBRAL ARTERIES: Left dominant configuration. Both origins are clearly patent. There is no dissection, occlusion or flow-limiting stenosis to the skull base (V1-V3 segments). CTA HEAD FINDINGS POSTERIOR CIRCULATION: --Vertebral arteries: Normal V4 segments. --Inferior cerebellar arteries: Normal. --Basilar artery: Normal. --Superior cerebellar arteries: Normal. --Posterior cerebral arteries (PCA): Normal. ANTERIOR CIRCULATION: --Intracranial internal carotid arteries: Normal. --Anterior cerebral arteries (ACA): Normal. Both A1 segments are present. Patent anterior communicating artery (a-comm). --Middle cerebral arteries (MCA): Normal. VENOUS SINUSES: As permitted by contrast timing, patent. ANATOMIC VARIANTS: None Review of the MIP images confirms the above findings. IMPRESSION: Normal CTA of the head and neck. Electronically Signed   By: Deatra Robinson  M.D.   On: 05/22/2021 02:24   CT Cervical Spine Wo Contrast  Result Date: 05/21/2021 CLINICAL DATA:  Neck pain, reported cervical spine fracture EXAM: CT CERVICAL SPINE WITHOUT CONTRAST TECHNIQUE: Multidetector CT imaging of the cervical spine was performed without intravenous contrast. Multiplanar CT image reconstructions were also generated. COMPARISON:  03/02/2020 FINDINGS: Alignment: Alignment is anatomic. Skull base and vertebrae: No acute fracture. No primary bone lesion or focal pathologic process. Soft tissues and spinal canal: No prevertebral fluid or swelling. No visible canal hematoma. Disc levels:  No significant spondylosis or facet hypertrophy. Upper chest: Central airway is patent. Visualized portions of the lung apices are clear. Other: Reconstructed images demonstrate no additional findings. IMPRESSION: 1. No acute cervical spine fracture.  Unremarkable exam. Electronically Signed   By: Sharlet Salina M.D.   On: 05/21/2021 20:21    Procedures Procedures    Medications Ordered in ED Medications  ketorolac (TORADOL) 15 MG/ML injection 15 mg (has no administration in time range)  oxyCODONE-acetaminophen (PERCOCET/ROXICET) 5-325 MG per tablet 1 tablet (1 tablet Oral Given 05/21/21 2320)  iohexol (OMNIPAQUE) 350 MG/ML injection 100 mL (100 mLs Intravenous Contrast Given 05/22/21 0210)    ED Course  I have reviewed the triage vital signs and the nursing notes.  Pertinent labs & imaging results that were available during my care of the patient were reviewed by me and considered in my medical decision making (see chart for details).    MDM Rules/Calculators/A&P                           30 yo  F with a chief complaint of neck pain and numbness and weakness to the left upper extremity.  This occurred shortly after being seen by a chiropractor.  Had a CT scan here that was negative for fracture.  CT angiogram ordered of the head neck to evaluate for possible dissection.  CT angiogram negative.  Will discharge home.  Given neurology follow-up.  PCP follow-up.  2:33 AM:  I have discussed the diagnosis/risks/treatment options with the patient and family and believe the pt to be eligible for discharge home to follow-up with PCP. We also discussed returning to the ED immediately if new or worsening sx occur. We discussed the sx which are most concerning (e.g., sudden worsening pain, fever, inability to tolerate by mouth) that necessitate immediate return. Medications administered to the patient during their visit and any new prescriptions provided to the patient are listed below.  Medications given during this visit Medications  ketorolac (TORADOL) 15 MG/ML injection 15 mg (has no administration in time range)  oxyCODONE-acetaminophen (PERCOCET/ROXICET) 5-325 MG per tablet 1 tablet (1 tablet Oral Given 05/21/21 2320)  iohexol (OMNIPAQUE) 350 MG/ML injection 100 mL (100 mLs Intravenous Contrast Given 05/22/21 0210)     The patient appears reasonably  screen and/or stabilized for discharge and I doubt any other medical condition or other [facility] requiring further screening, evaluation, or treatment in the ED at this time prior to discharge.   Final Clinical Impression(s) / ED Diagnoses Final diagnoses:  Acute strain of neck muscle, initial encounter  Cervical radiculopathy    Rx / DC Orders ED Discharge Orders          Ordered    Ambulatory referral to Neurology       Comments: Radiculopathy   05/22/21 0232             Melene Plan, DO 05/22/21 [phone]

## 2021-05-22 NOTE — ED Notes (Addendum)
Pt A&OX4 ambulatory at d/c with independent steady gait 

## 2022-07-25 LAB — OB RESULTS CONSOLE ABO/RH: RH Type: POSITIVE

## 2022-07-25 LAB — OB RESULTS CONSOLE RPR: RPR: NONREACTIVE

## 2022-07-25 LAB — OB RESULTS CONSOLE GC/CHLAMYDIA
Chlamydia: NEGATIVE
Neisseria Gonorrhea: NEGATIVE

## 2022-07-25 LAB — OB RESULTS CONSOLE ANTIBODY SCREEN: Antibody Screen: NEGATIVE

## 2022-07-25 LAB — OB RESULTS CONSOLE RUBELLA ANTIBODY, IGM: Rubella: IMMUNE

## 2022-07-25 LAB — HEPATITIS C ANTIBODY: HCV Ab: NEGATIVE

## 2022-07-25 LAB — OB RESULTS CONSOLE HIV ANTIBODY (ROUTINE TESTING): HIV: NONREACTIVE

## 2022-07-25 LAB — OB RESULTS CONSOLE HEPATITIS B SURFACE ANTIGEN: Hepatitis B Surface Ag: NEGATIVE

## 2022-08-22 NOTE — L&D Delivery Note (Signed)
Patient was C/C/+2 and pushed for one contraction with epidural.    NSVD  female infant, Apgars 7/9, weight pending.   The patient had a small 2nd degree tear repaired with 3-0 vicryl. Fundus was firm. EBL was expected amount. Placenta was delivered intact. Vagina was clear.  Delayed cord clamping done for 30-60 seconds while warming baby. Baby was vigorous and doing skin to skin with mother.  Philip Aspen

## 2022-10-24 DIAGNOSIS — Z363 Encounter for antenatal screening for malformations: Secondary | ICD-10-CM | POA: Diagnosis not present

## 2022-10-24 DIAGNOSIS — Z3A21 21 weeks gestation of pregnancy: Secondary | ICD-10-CM | POA: Diagnosis not present

## 2023-01-10 ENCOUNTER — Ambulatory Visit: Payer: Self-pay | Admitting: Physical Therapy

## 2023-02-10 ENCOUNTER — Ambulatory Visit: Payer: Medicaid Other | Attending: Obstetrics and Gynecology | Admitting: Physical Therapy

## 2023-02-10 ENCOUNTER — Encounter: Payer: Self-pay | Admitting: Physical Therapy

## 2023-02-10 ENCOUNTER — Other Ambulatory Visit: Payer: Self-pay

## 2023-02-10 DIAGNOSIS — M5459 Other low back pain: Secondary | ICD-10-CM | POA: Diagnosis present

## 2023-02-10 DIAGNOSIS — M62838 Other muscle spasm: Secondary | ICD-10-CM | POA: Diagnosis present

## 2023-02-10 LAB — OB RESULTS CONSOLE GBS: GBS: NEGATIVE

## 2023-02-10 NOTE — Therapy (Addendum)
OUTPATIENT PHYSICAL THERAPY FEMALE PELVIC EVALUATION   Patient Name: Janet Li MRN: [id number] DOB:04/12/1991, 32 y.o., female Today's Date: 02/10/2023  END OF SESSION:  PT End of Session - 02/12/23 1756     Visit Number 1    Date for PT Re-Evaluation 05/05/23    Authorization Type Rolene Arbour - auth needed    PT Start Time 0927    PT Stop Time 1013    PT Time Calculation (min) 46 min    Activity Tolerance Patient tolerated treatment well    Behavior During Therapy [facility] for tasks assessed/performed             Past Medical History:  Diagnosis Date   Hx of varicella    Medical history non-contributory    Past Surgical History:  Procedure Laterality Date   APPENDECTOMY     Patient Active Problem List   Diagnosis Date Noted   Normal labor 09/28/2019   Precipitous delivery, delivered (current hospitalization) 09/28/2019   Indication for care in labor or delivery 08/08/2017   SVD (spontaneous vaginal delivery) 08/08/2017   Premature rupture of membranes 05/07/2016    PCP: Gordan Payment., MD   REFERRING PROVIDER: Arby Barrette, FNP   REFERRING DIAG: R10.2 (ICD-10-CM) - Pelvic and perineal pain   THERAPY DIAG:  Other low back pain  Other muscle spasm  Rationale for Evaluation and Treatment: Rehabilitation  ONSET DATE: during this pregnancy, maybe 30 weeks ago  SUBJECTIVE:                                                                                                                                                                                           SUBJECTIVE STATEMENT: I feel popping pubic symphysis out of place all the time, constant pain, pressure in the back and pain down the legs more on the Rt. Fluid intake:    PAIN:  Are you having pain? Yes NPRS scale: 5/10 now, gets up to 8-9/10 Pain location:  anterior pelvis  Pain type: sharp Pain description: intermittent   Aggravating factors: sitting for a long time and walking for longer time,  intercourse, turning in bed Relieving factors: lying down  PRECAUTIONS: Other: [redacted] weeks pregnant  WEIGHT BEARING RESTRICTIONS: No  FALLS:  Has patient fallen in last 6 months? 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  PLOF: Independent  PATIENT GOALS: get rid of the pain  PERTINENT HISTORY:  3 vaginal with tearing Sexual abuse: No  BOWEL MOVEMENT: No issues  URINATION: Urgency: No Frequency: normal for pregnancy Leakage: Coughing, Sneezing,  and Laughing Pads: No  INTERCOURSE: Pain with intercourse: After Intercourse Ability to have vaginal penetration:  Yes:   Climax:  Marinoff Scale: 1/3  PREGNANCY: Vaginal deliveries 3 Tearing Yes: all three  Currently pregnant Yes: 36 weeks at eval  PROLAPSE: None   OBJECTIVE:    PATIENT SURVEYS:    PFIQ-7 = 48  COGNITION: Overall cognitive status: Within functional limits for tasks assessed     SENSATION:   MUSCLE LENGTH: Hamstrings: 60%   LUMBAR SPECIAL TESTS:    FUNCTIONAL TESTS:  Single leg standing - unsteady no increased pain  GAIT:  Comments: shortened stride and pregnancy posture  POSTURE: increased lumbar lordosis and anterior pelvic tilt  PELVIC ALIGNMENT:  LUMBARAROM/PROM:  A/PROM A/PROM  eval  Flexion WFL   Extension WFL   Right lateral flexion WFL   Left lateral flexion WFL   Right rotation WFL   Left rotation WFL    (Blank rows = not tested)  LOWER EXTREMITY ROM:  Passive ROM Right eval Left eval  Hip flexion    Hip extension    Hip abduction    Hip adduction    Hip internal rotation    Hip external rotation 75% 75%  Knee flexion    Knee extension    Ankle dorsiflexion    Ankle plantarflexion    Ankle inversion    Ankle eversion     (Blank rows = not tested)  LOWER EXTREMITY MMT:  MMT Right eval Left eval  Hip flexion    Hip extension    Hip abduction 3 4   Hip adduction 4 +pain 4 +pain  Hip internal rotation    Hip external rotation    Knee flexion    Knee extension    Ankle dorsiflexion    Ankle plantarflexion    Ankle inversion    Ankle eversion     PALPATION:   General  TTP pubic symphasis                External Perineal Exam NA                             Internal Pelvic Floor NA  Patient confirms identification and approves PT to assess internal pelvic floor and treatment deferred  PELVIC MMT:   MMT eval  Vaginal   Internal Anal Sphincter   External Anal Sphincter   Puborectalis   Diastasis Recti   (Blank rows = not tested)        TONE:   PROLAPSE:   TODAY'S TREATMENT:                                                                                                                              DATE: 02/10/23  EVAL and initial HEP/self care - massage gluteals with tennis ball, bed mobility with knees together and exhale with engaged core Check all possible CPT codes: 69629 -  Therapeutic Activities and 16109 - Self Care    Check all conditions that are expected to impact treatment: {Conditions expected to impact treatment:Musculoskeletal disorders and Current pregnancy or recent postpartum   If treatment provided at initial evaluation, no treatment charged due to lack of authorization.       PATIENT EDUCATION:  Education details: Access Code: [email or web address] Person educated: Patient Education method: Explanation, Demonstration, Actor cues, Verbal cues, and Handouts Education comprehension: verbalized understanding and returned demonstration  HOME EXERCISE PROGRAM: Access Code: [email or web address] URL: [email or web address] Date: 02/12/2023 Prepared by: Dwana Curd  Exercises - Supine Transversus Abdominis Bracing - Hands on Ground  - 1 x daily - 7 x weekly - 3 sets - 10 reps - Supine Figure 4 Piriformis Stretch  - 1 x daily - 7 x weekly - 1 sets - 3 reps - 30 sec hold - Quadruped Rocking Slow  - 1  x daily - 7 x weekly - 3 sets - 10 reps - Quadruped Circle Weight Shifts  - 1 x daily - 7 x weekly - 3 sets - 10 reps - Quadruped Alternating Arm Lift  - 1 x daily - 7 x weekly - 2 sets - 10 reps  ASSESSMENT:  CLINICAL IMPRESSION: Patient is a 32 y.o. pregnant female with [redacted] weeks gestation of 4th child who was seen today for physical therapy evaluation and treatment for pelvic pain.  Pt has tension, pregnancy posture, increased lumbar lordosis and core weakness all contributing to pain.  Pt was given exercises and stretches today due to advanced state of pregnancy and will benefit from skilled PT including aquatic therapy due to difficulty moving on land with excess pregnancy weight.  Pt will benefit from skilled PT to address muscle imbalances and to guide pt on how to do functional activities with correct techniques for pain management.  OBJECTIVE IMPAIRMENTS: decreased activity tolerance, decreased coordination, decreased endurance, decreased ROM, decreased strength, increased muscle spasms, impaired flexibility, impaired tone, postural dysfunction, and pain.   ACTIVITY LIMITATIONS: lifting, bending, sitting, standing, sleeping, continence, and caring for others  PARTICIPATION LIMITATIONS: interpersonal relationship, community activity, and occupation  PERSONAL FACTORS: Time since onset of injury/illness/exacerbation and 1-2 comorbidities: pregnant, 3 prior vaginal deliveries  are also affecting patient's functional outcome.   REHAB POTENTIAL: Excellent  CLINICAL DECISION MAKING: Evolving/moderate complexity  EVALUATION COMPLEXITY: Moderate   GOALS: Goals reviewed with patient? Yes  SHORT TERM GOALS: Target date: 03/10/23  Ind with initiaL HEP Baseline: Goal status: INITIAL   LONG TERM GOALS: Target date: 05/05/23  Pt will be independent with advanced HEP to maintain improvements made throughout therapy  Baseline:  Goal status: INITIAL  2.  Pt will report 75% reduction of  pain due to improvements in posture, strength, and muscle length  Baseline:  Goal status: INITIAL  3.  Pt will be able to perform all transfers without pain and popping due to increased strength Baseline:  Goal status: INITIAL  4.  Pt will be aware of pelvic floor and how to contract and relax muscles with coordination during functional activities. Baseline:  Goal status: INITIAL   PLAN:  PT FREQUENCY: 2x/week  PT DURATION: 12 weeks (until duration of pregnancy)  PLANNED INTERVENTIONS: Therapeutic exercises, Therapeutic activity, Neuromuscular re-education, Balance training, Gait training, Patient/Family education, Self Care, Joint mobilization, Aquatic Therapy, Dry Needling, Electrical stimulation, Cryotherapy, Moist heat, Taping, Biofeedback, Manual therapy, and Re-evaluation  PLAN FOR NEXT SESSION: taping, dry needling gluteals, core strength, posture   Brayton Caves Rhesa Forsberg, PT 02/12/2023, 5:56  PM   PHYSICAL THERAPY DISCHARGE SUMMARY  Visits from Start of Care: 1  Current functional level related to goals / functional outcomes: See above - eval only   Remaining deficits: See above details   Education / Equipment: HEP   Patient agrees to discharge. Patient goals were not met. Patient is being discharged due to  pt being induced and was unable to get any follow up appointment. Russella Dar, PT, DPT 03/02/23 12:36 PM

## 2023-02-17 ENCOUNTER — Encounter: Payer: Self-pay | Admitting: Cardiology

## 2023-02-17 ENCOUNTER — Other Ambulatory Visit: Payer: Self-pay

## 2023-02-17 ENCOUNTER — Ambulatory Visit (INDEPENDENT_AMBULATORY_CARE_PROVIDER_SITE_OTHER): Payer: Medicaid Other | Admitting: Cardiology

## 2023-02-17 VITALS — BP 117/69 | HR 73 | Ht 64.0 in | Wt 220.4 lb

## 2023-02-17 DIAGNOSIS — R55 Syncope and collapse: Secondary | ICD-10-CM

## 2023-02-17 DIAGNOSIS — R002 Palpitations: Secondary | ICD-10-CM

## 2023-02-17 NOTE — Patient Instructions (Addendum)
Medication Instructions:  Your physician recommends that you continue on your current medications as directed. Please refer to the Current Medication list given to you today.  *If you need a refill on your cardiac medications before your next appointment, please call your pharmacy*   Lab Work: None   Testing/Procedures: Your physician has requested that you have an echocardiogram - urgent - pregnant. Echocardiography is a painless test that uses sound waves to create images of your heart. It provides your doctor with information about the size and shape of your heart and how well your heart's chambers and valves are working. This procedure takes approximately one hour. There are no restrictions for this procedure. Please do NOT wear cologne, perfume, aftershave, or lotions (deodorant is allowed). Please arrive 15 minutes prior to your appointment time.    Follow-Up: At [facility], you and your health needs are our priority.  As part of our continuing mission to provide you with exceptional heart care, we have created designated Provider Care Teams.  These Care Teams include your primary Cardiologist (physician) and Advanced Practice Providers (APPs -  Physician Assistants and Nurse Practitioners) who all work together to provide you with the care you need, when you need it.  Your next appointment:   8 week(s)  Provider:   Thomasene Ripple, DO

## 2023-02-17 NOTE — Progress Notes (Signed)
Cardio-Obstetrics Clinic  New Evaluation  Date:  02/20/2023   ID:  Janet Li, DOB Sep 27, 1990, MRN [id number]  PCP:  Janet Li., MD   [facility] Providers Cardiologist:  None  Electrophysiologist:  None       Referring MD: Lavina Hamman, MD   Chief Complaint: " I am having shortness of breath and palpitations"  History of Present Illness:    Janet Li is a 32 y.o. female [G4P3013] who is being seen today for the evaluation of shortness of breath at the request of Li, Todd, MD.   No reported medical history - she come with her husband and 3 kids. She reports that she has been experiencing intermittent palpitation and worsening shortness of breath.   This is concerning as she has never experience this with the other kids. She tells me that she will get heart rate that will abruptly go to the 120s -130s and then come back down less than 5 minutes   Chart review she  Prior CV Studies Reviewed: The following studies were reviewed today:  Ambulatory monitor done at [facility] 04/2022 - normal   Past Medical History:  Diagnosis Date   Hx of varicella    Medical history non-contributory     Past Surgical History:  Procedure Laterality Date   APPENDECTOMY        OB History     Gravida  4   Para  3   Term  3   Preterm      AB  1   Living  3      SAB  1   IAB      Ectopic      Multiple  0   Live Births  3               Current Medications: Current Meds  Medication Sig   acetaminophen (TYLENOL) 500 MG tablet Take 500-1,000 mg by mouth every 6 (six) hours as needed for moderate pain or headache.   Prenatal Vit-Fe Fumarate-FA (PRENATAL MULTIVITAMIN) TABS tablet Take 1 tablet by mouth daily at 12 noon.     Allergies:   Patient has no known allergies.   Social History   Socioeconomic History   Marital status: Married    Spouse name: Danny    Number of children: Not on file   Years of education: Not on file    Highest education level: Not on file  Occupational History   Not on file  Tobacco Use   Smoking status: Never   Smokeless tobacco: Never  Substance and Sexual Activity   Alcohol use: No   Drug use: No   Sexual activity: Yes    Birth control/protection: Condom  Other Topics Concern   Not on file  Social History Narrative   Not on file   Social Determinants of Health   Financial Resource Strain: Not on file  Food Insecurity: Not on file  Transportation Needs: Not on file  Physical Activity: Not on file  Stress: Not on file  Social Connections: Not on file      Family History  Problem Relation Age of Onset   Hypertension Father       ROS:   Please see the history of present illness.     All other systems reviewed and are negative.   Labs/EKG Reviewed:    EKG:   EKG is was ordered today.  The ekg ordered today demonstrates Normal sinus rhythm HR 73 bpm  Recent Labs: No results found for requested labs within last 365 days.   Recent Lipid Panel No results found for: "CHOL", "TRIG", "HDL", "CHOLHDL", "LDLCALC", "LDLDIRECT"  Physical Exam:    VS:  BP 117/69 (BP Location: Left Arm, Patient Position: Sitting, Cuff Size: Normal)   Pulse 73   Ht 5\' 4"  (1.626 m)   Wt 220 lb 6.4 oz (100 kg)   BMI 37.83 kg/m     Wt Readings from Last 3 Encounters:  02/17/23 220 lb 6.4 oz (100 kg)  05/21/21 199 lb 15.3 oz (90.7 kg)  09/28/19 200 lb (90.7 kg)     GEN:  Well nourished, well developed in no acute distress HEENT: Normal NECK: No JVD; No carotid bruits LYMPHATICS: No lymphadenopathy CARDIAC: RRR, no murmurs, rubs, gallops RESPIRATORY:  Clear to auscultation without rales, wheezing or rhonchi  ABDOMEN: Soft, non-tender, non-distended MUSCULOSKELETAL:  No edema; No deformity  SKIN: Warm and dry NEUROLOGIC:  Alert and oriented x 3 PSYCHIATRIC:  Normal affect    Risk Assessment/Risk Calculators:     CARPREG II Risk Prediction Index Score:  1.  The patient's  risk for a primary cardiac event is 5%.            ASSESSMENT & PLAN:    Palpitations Shortness of breath   She recently about a year ago had a monitor telemetry health which was normal.  Her increasing heart rate during the third trimester of pregnancy is normal.  She is not sustaining in high heart rates over 140.  Will continue to monitor her.  She is not passing out. Will get an echocardiogram to assess to make sure that there is no structural abnormalities contributing to her shortness of breath.  The patient is in agreement with the above plan. The patient left the office in stable condition.  The patient will follow up in Patient Instructions  Medication Instructions:  Your physician recommends that you continue on your current medications as directed. Please refer to the Current Medication list given to you today.  *If you need a refill on your cardiac medications before your next appointment, please call your pharmacy*   Lab Work: None   Testing/Procedures: Your physician has requested that you have an echocardiogram - urgent - pregnant. Echocardiography is a painless test that uses sound waves to create images of your heart. It provides your doctor with information about the size and shape of your heart and how well your heart's chambers and valves are working. This procedure takes approximately one hour. There are no restrictions for this procedure. Please do NOT wear cologne, perfume, aftershave, or lotions (deodorant is allowed). Please arrive 15 minutes prior to your appointment time.    Follow-Up: At [facility], you and your health needs are our priority.  As part of our continuing mission to provide you with exceptional heart care, we have created designated Provider Care Teams.  These Care Teams include your primary Cardiologist (physician) and Advanced Practice Providers (APPs -  Physician Assistants and Nurse Practitioners) who all work together to  provide you with the care you need, when you need it.  Your next appointment:   8 week(s)  Provider:   Thomasene Ripple, DO     Dispo:  No follow-ups on file.   Medication Adjustments/Labs and Tests Ordered: Current medicines are reviewed at length with the patient today.  Concerns regarding medicines are outlined above.  Tests Ordered: Orders Placed This Encounter  Procedures   EKG 12-Lead  ECHOCARDIOGRAM COMPLETE   Medication Changes: No orders of the defined types were placed in this encounter.

## 2023-02-20 ENCOUNTER — Encounter (HOSPITAL_COMMUNITY): Payer: Self-pay

## 2023-02-24 ENCOUNTER — Ambulatory Visit (HOSPITAL_COMMUNITY): Payer: Medicaid Other | Attending: Cardiology

## 2023-02-24 DIAGNOSIS — R55 Syncope and collapse: Secondary | ICD-10-CM | POA: Insufficient documentation

## 2023-02-24 LAB — ECHOCARDIOGRAM COMPLETE
Area-P 1/2: 3.99 cm2
S' Lateral: 3.2 cm

## 2023-02-25 ENCOUNTER — Inpatient Hospital Stay (HOSPITAL_COMMUNITY)
Admission: AD | Admit: 2023-02-25 | Discharge: 2023-02-25 | Disposition: A | Discharge status: Home / Self Care | Payer: Medicaid Other | Attending: Obstetrics and Gynecology | Admitting: Obstetrics and Gynecology

## 2023-02-25 ENCOUNTER — Encounter (HOSPITAL_COMMUNITY): Payer: Self-pay

## 2023-02-25 DIAGNOSIS — O471 False labor at or after 37 completed weeks of gestation: Secondary | ICD-10-CM | POA: Insufficient documentation

## 2023-02-25 DIAGNOSIS — O479 False labor, unspecified: Secondary | ICD-10-CM

## 2023-02-25 DIAGNOSIS — Z3A38 38 weeks gestation of pregnancy: Secondary | ICD-10-CM | POA: Diagnosis not present

## 2023-02-25 NOTE — MAU Note (Signed)
.  Janet Li is a 32 y.o. at [redacted]w[redacted]d here in MAU reporting: CTX since yesterday after having a cervical exam in the office. She reports since then she has also continued to have light pink vaginal bleeding accompanied by mucous. She reports her CTX's worsened this morning and are now 9-10 minutes apart. She reports she has had a history of fast labors so she was worried and did not want to deliver at home. Denies blood clots. Denies LOF. +FM.  GBS-. Hx precipitous labor and delivery. Patient seen in office yesterday. Cervical exam 1 and 30%.   Onset of complaint: Yesterday Pain score: 4/10 lower abdomen  FHT: 140 initial external Lab orders placed from triage:  MAU Labor Eval

## 2023-02-25 NOTE — MAU Provider Note (Signed)
None    S: Janet Li is a 32 y.o. [phone] at [redacted]w[redacted]d who presents to MAU for RN labor check. Fetal tracing, vital signs, and chart reviewed.   O: BP 117/60 (BP Location: Right Arm)   Pulse 96   Temp 97.9 F (36.6 C) (Oral)   Resp 15   Ht 5\' 4"  (1.626 m)   Wt 99.8 kg   SpO2 99%   BMI 37.76 kg/m   Cervical exam:  Dilation: 1 Effacement (%): Thick Cervical Position: Posterior Station: -3 Exam by:: Valla Leaver, RN   Fetal Monitoring: Baseline: 135 bpm Variability: moderate Accelerations: +15x15 present Decelerations: absent Contractions: 2 UCs  Patient requesting to be discharged home  A: [facility] at [redacted]w[redacted]d  False labor   P: RN to discharge patient home in stable condition Labor precautions and fetal kick counts given Keep OB appointment as scheduled on Monday 7/8 Return to MAU as needed   Brand Males, CNM 02/25/2023 10:52 AM

## 2023-02-27 ENCOUNTER — Encounter (HOSPITAL_BASED_OUTPATIENT_CLINIC_OR_DEPARTMENT_OTHER): Payer: Medicaid Other | Admitting: Physical Therapy

## 2023-02-28 ENCOUNTER — Encounter (HOSPITAL_COMMUNITY): Payer: Self-pay

## 2023-02-28 ENCOUNTER — Telehealth (HOSPITAL_COMMUNITY): Payer: Self-pay | Admitting: *Deleted

## 2023-02-28 NOTE — Telephone Encounter (Signed)
Preadmission screen  

## 2023-03-01 ENCOUNTER — Encounter (HOSPITAL_COMMUNITY): Payer: Self-pay | Admitting: *Deleted

## 2023-03-01 ENCOUNTER — Telehealth (HOSPITAL_COMMUNITY): Payer: Self-pay | Admitting: *Deleted

## 2023-03-01 NOTE — Telephone Encounter (Signed)
Preadmission screen  

## 2023-03-04 ENCOUNTER — Inpatient Hospital Stay (HOSPITAL_COMMUNITY): Payer: Medicaid Other | Admitting: Anesthesiology

## 2023-03-04 ENCOUNTER — Other Ambulatory Visit: Payer: Self-pay

## 2023-03-04 ENCOUNTER — Encounter (HOSPITAL_COMMUNITY): Payer: Self-pay | Admitting: Obstetrics and Gynecology

## 2023-03-04 ENCOUNTER — Inpatient Hospital Stay (HOSPITAL_COMMUNITY)
Admission: AD | Admit: 2023-03-04 | Discharge: 2023-03-06 | DRG: 807 | Disposition: A | Discharge status: Home / Self Care | Payer: Medicaid Other | Attending: Obstetrics and Gynecology | Admitting: Obstetrics and Gynecology

## 2023-03-04 DIAGNOSIS — O99214 Obesity complicating childbirth: Secondary | ICD-10-CM | POA: Diagnosis present

## 2023-03-04 DIAGNOSIS — Z3A39 39 weeks gestation of pregnancy: Secondary | ICD-10-CM | POA: Diagnosis not present

## 2023-03-04 DIAGNOSIS — O26893 Other specified pregnancy related conditions, third trimester: Secondary | ICD-10-CM | POA: Diagnosis present

## 2023-03-04 LAB — CBC
HCT: 35.9 % — ABNORMAL LOW (ref 36.0–46.0)
Hemoglobin: 12.2 g/dL (ref 12.0–15.0)
MCH: 28.2 pg (ref 26.0–34.0)
MCHC: 34 g/dL (ref 30.0–36.0)
MCV: 83.1 fL (ref 80.0–100.0)
Platelets: 200 10*3/uL (ref 150–400)
RBC: 4.32 MIL/uL (ref 3.87–5.11)
RDW: 14.5 % (ref 11.5–15.5)
WBC: 9.8 10*3/uL (ref 4.0–10.5)
nRBC: 0 % (ref 0.0–0.2)

## 2023-03-04 LAB — TYPE AND SCREEN
ABO/RH(D): O POS
Antibody Screen: NEGATIVE

## 2023-03-04 LAB — RPR: RPR Ser Ql: NONREACTIVE

## 2023-03-04 MED ORDER — PHENYLEPHRINE 80 MCG/ML (10ML) SYRINGE FOR IV PUSH (FOR BLOOD PRESSURE SUPPORT)
80.0000 ug | PREFILLED_SYRINGE | INTRAVENOUS | Status: DC | PRN
  Filled 2023-03-04: qty 10

## 2023-03-04 MED ORDER — ONDANSETRON HCL 4 MG/2ML IJ SOLN: 4.0000 mg | INTRAMUSCULAR | Status: DC | PRN

## 2023-03-04 MED ORDER — ZOLPIDEM TARTRATE 5 MG PO TABS: 5.0000 mg | ORAL_TABLET | Freq: Every evening | ORAL | Status: DC | PRN

## 2023-03-04 MED ORDER — SIMETHICONE 80 MG PO CHEW: 80.0000 mg | CHEWABLE_TABLET | ORAL | Status: DC | PRN

## 2023-03-04 MED ORDER — LACTATED RINGERS IV SOLN: INTRAVENOUS | Status: DC

## 2023-03-04 MED ORDER — OXYTOCIN-SODIUM CHLORIDE 30-0.9 UT/500ML-% IV SOLN: 2.5000 [IU]/h | INTRAVENOUS | Status: DC

## 2023-03-04 MED ORDER — OXYTOCIN-SODIUM CHLORIDE 30-0.9 UT/500ML-% IV SOLN
1.0000 m[IU]/min | INTRAVENOUS | Status: DC
  Administered 2023-03-04: 2 m[IU]/min via INTRAVENOUS
  Filled 2023-03-04: qty 500

## 2023-03-04 MED ORDER — IBUPROFEN 600 MG PO TABS
600.0000 mg | ORAL_TABLET | Freq: Four times a day (QID) | ORAL | Status: DC
  Administered 2023-03-04 – 2023-03-06 (×7): 600 mg via ORAL
  Filled 2023-03-04 (×8): qty 1

## 2023-03-04 MED ORDER — SENNOSIDES-DOCUSATE SODIUM 8.6-50 MG PO TABS
2.0000 | ORAL_TABLET | Freq: Every day | ORAL | Status: DC
  Administered 2023-03-05 – 2023-03-06 (×2): 2 via ORAL
  Filled 2023-03-04 (×2): qty 2

## 2023-03-04 MED ORDER — ONDANSETRON HCL 4 MG PO TABS: 4.0000 mg | ORAL_TABLET | ORAL | Status: DC | PRN

## 2023-03-04 MED ORDER — EPHEDRINE 5 MG/ML INJ
10.0000 mg | INTRAVENOUS | Status: DC | PRN
  Filled 2023-03-04: qty 5

## 2023-03-04 MED ORDER — SOD CITRATE-CITRIC ACID 500-334 MG/5ML PO SOLN: 30.0000 mL | ORAL | Status: DC | PRN

## 2023-03-04 MED ORDER — FENTANYL-BUPIVACAINE-NACL 0.5-0.125-0.9 MG/250ML-% EP SOLN
12.0000 mL/h | EPIDURAL | Status: DC | PRN
  Administered 2023-03-04: 12 mL/h via EPIDURAL
  Filled 2023-03-04: qty 250

## 2023-03-04 MED ORDER — OXYCODONE HCL 5 MG PO TABS: 10.0000 mg | ORAL_TABLET | ORAL | Status: DC | PRN

## 2023-03-04 MED ORDER — OXYTOCIN BOLUS FROM INFUSION
333.0000 mL | Freq: Once | INTRAVENOUS | Status: AC
  Administered 2023-03-04: 333 mL via INTRAVENOUS

## 2023-03-04 MED ORDER — ONDANSETRON HCL 4 MG/2ML IJ SOLN: 4.0000 mg | Freq: Four times a day (QID) | INTRAMUSCULAR | Status: DC | PRN

## 2023-03-04 MED ORDER — LACTATED RINGERS IV SOLN: 500.0000 mL | INTRAVENOUS | Status: DC | PRN

## 2023-03-04 MED ORDER — OXYCODONE-ACETAMINOPHEN 5-325 MG PO TABS: 2.0000 | ORAL_TABLET | ORAL | Status: DC | PRN

## 2023-03-04 MED ORDER — OXYCODONE-ACETAMINOPHEN 5-325 MG PO TABS: 1.0000 | ORAL_TABLET | ORAL | Status: DC | PRN

## 2023-03-04 MED ORDER — PRENATAL MULTIVITAMIN CH
1.0000 | ORAL_TABLET | Freq: Every day | ORAL | Status: DC
  Administered 2023-03-05: 1 via ORAL
  Filled 2023-03-04 (×3): qty 1

## 2023-03-04 MED ORDER — BENZOCAINE-MENTHOL 20-0.5 % EX AERO: 1.0000 | INHALATION_SPRAY | CUTANEOUS | Status: DC | PRN

## 2023-03-04 MED ORDER — ACETAMINOPHEN 325 MG PO TABS: 650.0000 mg | ORAL_TABLET | ORAL | Status: DC | PRN

## 2023-03-04 MED ORDER — LIDOCAINE HCL (PF) 1 % IJ SOLN: 30.0000 mL | INTRAMUSCULAR | Status: DC | PRN

## 2023-03-04 MED ORDER — PHENYLEPHRINE 80 MCG/ML (10ML) SYRINGE FOR IV PUSH (FOR BLOOD PRESSURE SUPPORT): 80.0000 ug | PREFILLED_SYRINGE | INTRAVENOUS | Status: DC | PRN

## 2023-03-04 MED ORDER — TERBUTALINE SULFATE 1 MG/ML IJ SOLN: 0.2500 mg | Freq: Once | INTRAMUSCULAR | Status: DC | PRN

## 2023-03-04 MED ORDER — WITCH HAZEL-GLYCERIN EX PADS: 1.0000 | MEDICATED_PAD | CUTANEOUS | Status: DC | PRN

## 2023-03-04 MED ORDER — COCONUT OIL OIL: 1.0000 | TOPICAL_OIL | Status: DC | PRN

## 2023-03-04 MED ORDER — LIDOCAINE HCL (PF) 1 % IJ SOLN
INTRAMUSCULAR | Status: DC | PRN
  Administered 2023-03-04 (×2): 5 mL via EPIDURAL

## 2023-03-04 MED ORDER — LACTATED RINGERS IV SOLN: 500.0000 mL | Freq: Once | INTRAVENOUS | Status: AC

## 2023-03-04 MED ORDER — DIBUCAINE (PERIANAL) 1 % EX OINT: 1.0000 | TOPICAL_OINTMENT | CUTANEOUS | Status: DC | PRN

## 2023-03-04 MED ORDER — EPHEDRINE 5 MG/ML INJ: 10.0000 mg | INTRAVENOUS | Status: DC | PRN

## 2023-03-04 MED ORDER — TETANUS-DIPHTH-ACELL PERTUSSIS 5-2.5-18.5 LF-MCG/0.5 IM SUSY: 0.5000 mL | PREFILLED_SYRINGE | Freq: Once | INTRAMUSCULAR | Status: DC

## 2023-03-04 MED ORDER — DIPHENHYDRAMINE HCL 25 MG PO CAPS: 25.0000 mg | ORAL_CAPSULE | Freq: Four times a day (QID) | ORAL | Status: DC | PRN

## 2023-03-04 MED ORDER — OXYCODONE HCL 5 MG PO TABS: 5.0000 mg | ORAL_TABLET | ORAL | Status: DC | PRN

## 2023-03-04 MED ORDER — DIPHENHYDRAMINE HCL 50 MG/ML IJ SOLN: 12.5000 mg | INTRAMUSCULAR | Status: DC | PRN

## 2023-03-04 NOTE — H&P (Signed)
Janet Li is a 32 y.o. female, G5 P3013, EGA 39+ weeks with EDC 7-15 presenting for elective induction.  Had precipitous delivery last pregnancy so they prefer induction.  PNC essentially uncomplicated.  OB History     Gravida  5   Para  3   Term  3   Preterm      AB  1   Living  3      SAB  1   IAB      Ectopic      Multiple  0   Live Births  3          Past Medical History:  Diagnosis Date   Hx of varicella    Medical history non-contributory    Past Surgical History:  Procedure Laterality Date   APPENDECTOMY     Family History: family history includes Hypertension in her father. Social History:  reports that she has never smoked. She has never used smokeless tobacco. She reports that she does not drink alcohol and does not use drugs.     Maternal Diabetes: No Genetic Screening: Normal Maternal Ultrasounds/Referrals: Normal Fetal Ultrasounds or other Referrals:  None Maternal Substance Abuse:  No Significant Maternal Medications:  None Significant Maternal Lab Results:  Group B Strep negative Number of Prenatal Visits:greater than 3 verified prenatal visits Other Comments:  None  Review of Systems  Respiratory: Negative.    Cardiovascular: Negative.    Maternal Medical History:  Fetal activity: Perceived fetal activity is normal.   Prenatal complications: no prenatal complications Prenatal Complications - Diabetes: none.     unknown if currently breastfeeding. Maternal Exam:  Uterine Assessment: Contraction strength is mild.  Contraction frequency is irregular.  Abdomen: Patient reports no abdominal tenderness. Estimated fetal weight is 8.5 lbs.   Fetal presentation: vertex Introitus: Normal vulva. Normal vagina.  Amniotic fluid character: not assessed. Pelvis: adequate for delivery.     Fetal Exam Fetal Monitor Review: Mode: ultrasound.   Baseline rate: 130.  Variability: moderate (6-25 bpm).   Pattern: accelerations present and  no decelerations.   Fetal State Assessment: Category I - tracings are normal.   Physical Exam Constitutional:      Appearance: Normal appearance.  Cardiovascular:     Rate and Rhythm: Normal rate and regular rhythm.  Pulmonary:     Effort: Pulmonary effort is normal. No respiratory distress.  Abdominal:     Palpations: Abdomen is soft.  Genitourinary:    General: Normal vulva.  Neurological:     Mental Status: She is alert.     Prenatal labs: ABO, Rh: --/--/PENDING (07/13 0620) O pos Antibody: PENDING (07/13 4166) neg Rubella: Immune (12/04 0000) RPR: Nonreactive (12/04 0000)  HBsAg: Negative (12/04 0000)  HIV: Non-reactive (12/04 0000)  GBS: Negative/-- (06/21 0000)   Assessment/Plan: IUP at 39+ weeks for elective induction, h/o precipitous delivery with last pregnancy.  Will admit, start pitocin, monitor progress, anticipate SVD   Janet Li Janet Li 03/04/2023, 6:41 AM

## 2023-03-04 NOTE — Anesthesia Preprocedure Evaluation (Addendum)
Anesthesia Evaluation  Patient identified by MRN, date of birth, ID band Patient awake    Reviewed: Allergy & Precautions, Patient's Chart, lab work & pertinent test results  Airway Mallampati: II       Dental no notable dental hx. (+) Teeth Intact   Pulmonary neg pulmonary ROS   Pulmonary exam normal        Cardiovascular negative cardio ROS Normal cardiovascular exam  Echo- 02/24/23  1. Left ventricular ejection fraction, by estimation, is 55 to 60%. The  left ventricle has normal function. The left ventricle has no regional  wall motion abnormalities. Left ventricular diastolic parameters were  normal. GLS -18.9%.   2. Right ventricular systolic function is normal. The right ventricular  size is normal. Tricuspid regurgitation signal is inadequate for assessing  PA pressure.   3. The mitral valve is normal in structure. No evidence of mitral valve  regurgitation. No evidence of mitral stenosis.   4. The aortic valve is tricuspid. Aortic valve regurgitation is not  visualized. No aortic stenosis is present.   5. The inferior vena cava is normal in size with greater than 50%  respiratory variability, suggesting right atrial pressure of 3 mmHg.      Neuro/Psych negative neurological ROS  negative psych ROS   GI/Hepatic Neg liver ROS,GERD  ,,  Endo/Other  Obesity  Renal/GU negative Renal ROS  negative genitourinary   Musculoskeletal negative musculoskeletal ROS (+)    Abdominal  (+) + obese  Peds  Hematology  (+) Blood dyscrasia, anemia   Anesthesia Other Findings   Reproductive/Obstetrics (+) Pregnancy                             Anesthesia Physical Anesthesia Plan  ASA: 2  Anesthesia Plan:    Post-op Pain Management: Minimal or no pain anticipated   Induction:   PONV Risk Score and Plan:   Airway Management Planned: Natural Airway  Additional Equipment:   Intra-op  Plan:   Post-operative Plan:   Informed Consent: I have reviewed the patients History and Physical, chart, labs and discussed the procedure including the risks, benefits and alternatives for the proposed anesthesia with the patient or authorized representative who has indicated his/her understanding and acceptance.       Plan Discussed with: Anesthesiologist  Anesthesia Plan Comments:         Anesthesia Quick Evaluation

## 2023-03-04 NOTE — Plan of Care (Signed)

## 2023-03-04 NOTE — Plan of Care (Signed)

## 2023-03-04 NOTE — Lactation Note (Signed)
This note was copied from a baby's chart. Lactation Consultation Note  Patient Name: Girl Thomesha Rickner Today's Date: 03/04/2023 Age:30 hours  P3, Per Birth Parent, she feels breastfeeding is going well and she is experienced with breastfeeding. Birth Parent will call LC services if needed PRN.   Maternal Data Has patient been taught Hand Expression?: Yes Does the patient have breastfeeding experience prior to this delivery?: Yes  Feeding    LATCH Score Latch: Grasps breast easily, tongue down, lips flanged, rhythmical sucking.  Audible Swallowing: Spontaneous and intermittent  Type of Nipple: Everted at rest and after stimulation  Comfort (Breast/Nipple): Soft / non-tender  Hold (Positioning): No assistance needed to correctly position infant at breast.  LATCH Score: 10   Lactation Tools Discussed/Used    Interventions Interventions: Skin to skin;Breast massage;Breast feeding basics reviewed;Assisted with latch  Discharge    Consult Status      Frederico Hamman 03/04/2023, 5:52 PM

## 2023-03-04 NOTE — Anesthesia Procedure Notes (Signed)
Epidural Patient location during procedure: OB Start time: 03/04/2023 9:23 AM End time: 03/04/2023 9:31 AM  Staffing Anesthesiologist: Mal Amabile, MD Performed: anesthesiologist   Preanesthetic Checklist Completed: patient identified, IV checked, site marked, risks and benefits discussed, surgical consent, monitors and equipment checked, pre-op evaluation and timeout performed  Epidural Patient position: sitting Prep: DuraPrep and site prepped and draped Patient monitoring: continuous pulse ox and blood pressure Approach: midline Location: L3-L4 Injection technique: LOR air  Needle:  Needle type: Tuohy  Needle gauge: 17 G Needle length: 9 cm and 9 Needle insertion depth: 5 cm Catheter type: closed end flexible Catheter size: 19 Gauge Catheter at skin depth: 10 cm Test dose: negative and Other  Assessment Events: blood not aspirated, no cerebrospinal fluid, injection not painful, no injection resistance, no paresthesia and negative IV test  Additional Notes Patient identified. Risks and benefits discussed including failed block, incomplete  Pain control, post dural puncture headache, nerve damage, paralysis, blood pressure Changes, nausea, vomiting, reactions to medications-both toxic and allergic and post Partum back pain. All questions were answered. Patient expressed understanding and wished to proceed. Sterile technique was used throughout procedure. Epidural site was Dressed with sterile barrier dressing. No paresthesias, signs of intravascular injection Or signs of intrathecal spread were encountered.  Patient was more comfortable after the epidural was dosed. Please see RN's note for documentation of vital signs and FHR which are stable. Reason for block:procedure for pain

## 2023-03-05 LAB — CBC
HCT: 35.2 % — ABNORMAL LOW (ref 36.0–46.0)
Hemoglobin: 11.7 g/dL — ABNORMAL LOW (ref 12.0–15.0)
MCH: 28.7 pg (ref 26.0–34.0)
MCHC: 33.2 g/dL (ref 30.0–36.0)
MCV: 86.3 fL (ref 80.0–100.0)
Platelets: 189 10*3/uL (ref 150–400)
RBC: 4.08 MIL/uL (ref 3.87–5.11)
RDW: 14.6 % (ref 11.5–15.5)
WBC: 12.5 10*3/uL — ABNORMAL HIGH (ref 4.0–10.5)
nRBC: 0 % (ref 0.0–0.2)

## 2023-03-05 NOTE — Progress Notes (Signed)
Patient is eating, ambulating, voiding.  Pain control is good.  Appropriate lochia, no complaints.  Vitals:   03/04/23 1734 03/04/23 2136 03/05/23 0116 03/05/23 0519  BP: (!) 127/59 (!) 102/50 (!) 96/59 (!) 104/53  Pulse: 89 89 85 81  Resp: 17 16 16 16   Temp: 98.6 F (37 C) 98.8 F (37.1 C) 97.8 F (36.6 C) 97.7 F (36.5 C)  TempSrc: Oral Oral Oral Oral  SpO2: 98% 97% 99% 98%  Weight:      Height:        Fundus firm Ext: no calf tenderness  Lab Results  Component Value Date   WBC 12.5 (H) 03/05/2023   HGB 11.7 (L) 03/05/2023   HCT 35.2 (L) 03/05/2023   MCV 86.3 03/05/2023   PLT 189 03/05/2023    --/--/O POS (07/13 1610)  A/P Post partum day 1. Considering d/c home later today, but will await eval of baby. Doing well.  Routine care.    Janet Li

## 2023-03-05 NOTE — Anesthesia Postprocedure Evaluation (Signed)
Anesthesia Post Note  Patient: Janet Li  Procedure(s) Performed: AN AD HOC LABOR EPIDURAL     Patient location during evaluation: Mother Baby Anesthesia Type: Epidural Level of consciousness: awake and alert Pain management: pain level controlled Vital Signs Assessment: post-procedure vital signs reviewed and stable Respiratory status: spontaneous breathing, nonlabored ventilation and respiratory function stable Cardiovascular status: stable Postop Assessment: no headache, no backache, epidural receding, no apparent nausea or vomiting, patient able to bend at knees, able to ambulate and adequate PO intake Anesthetic complications: no   No notable events documented.  Last Vitals:  Vitals:   03/05/23 0116 03/05/23 0519  BP: (!) 96/59 (!) 104/53  Pulse: 85 81  Resp: 16 16  Temp: 36.6 C 36.5 C  SpO2: 99% 98%    Last Pain:  Vitals:   03/05/23 0519  TempSrc: Oral  PainSc: 1    Pain Goal:                   Janet Li

## 2023-03-06 MED ORDER — IBUPROFEN 600 MG PO TABS: 600.0000 mg | ORAL_TABLET | Freq: Four times a day (QID) | ORAL | 0 refills | Status: DC | PRN

## 2023-03-06 NOTE — Discharge Summary (Signed)
Postpartum Discharge Summary       Patient Name: Janet Li DOB: 12/23/1990 MRN: [id number]  Date of admission: 03/04/2023 Delivery date:03/04/2023 Delivering provider: Philip Aspen Date of discharge: 03/06/2023  Admitting diagnosis: Normal labor [O80, Z37.9] Intrauterine pregnancy: [redacted]w[redacted]d     Secondary diagnosis:  Principal Problem:   Normal labor    Discharge diagnosis: Term Pregnancy Delivered                                              Post partum procedures: NA Augmentation: AROM and Pitocin Complications: None  Hospital course: Induction of Labor With Vaginal Delivery   32 y.o. yo [id number] at [redacted]w[redacted]d was admitted to the hospital 03/04/2023 for induction of labor.  Indication for induction: Elective.  Patient had an labor course complicated by nothing Membrane Rupture Time/Date: 11:08 AM,03/04/2023  Delivery Method:Vaginal, Spontaneous Episiotomy: None Lacerations:  2nd degree Details of delivery can be found in separate delivery note.  Patient had a postpartum course complicated by nothing. Patient is discharged home 03/06/23.  Newborn Data: Birth date:03/04/2023 Birth time:2:30 PM Gender:Female Living status:Living Apgars:7 ,9  Weight:3840 g  Magnesium Sulfate received: No BMZ received: No Rhophylac:N/A   Physical exam  Vitals:   03/05/23 0116 03/05/23 0519 03/05/23 1452 03/05/23 2119  BP: (!) 96/59 (!) 104/53 (!) 118/53 113/62  Pulse: 85 81 79 84  Resp: 16 16 18 16   Temp: 97.8 F (36.6 C) 97.7 F (36.5 C) 98.8 F (37.1 C) 98.1 F (36.7 C)  TempSrc: Oral Oral Oral Oral  SpO2: 99% 98% 99% 99%  Weight:      Height:       General: alert, cooperative, and no distress Lochia: appropriate Uterine Fundus: firm DVT Evaluation: No evidence of DVT seen on physical exam. Labs: Lab Results  Component Value Date   WBC 12.5 (H) 03/05/2023   HGB 11.7 (L) 03/05/2023   HCT 35.2 (L) 03/05/2023   MCV 86.3 03/05/2023   PLT 189 03/05/2023      Latest  Ref Rng & Units 05/21/2021    9:45 PM  CMP  Glucose 70 - 99 mg/dL 91   BUN 6 - 20 mg/dL 17   Creatinine 1.47 - 1.00 mg/dL 8.29   Sodium 562 - 130 mmol/L 139   Potassium 3.5 - 5.1 mmol/L 3.6   Chloride 98 - 111 mmol/L 109    Edinburgh Score:    09/29/2019   11:29 AM  Edinburgh Postnatal Depression Scale Screening Tool  I have been able to laugh and see the funny side of things. 0  I have looked forward with enjoyment to things. 0  I have blamed myself unnecessarily when things went wrong. 1  I have been anxious or worried for no good reason. 2  I have felt scared or panicky for no good reason. 1  Things have been getting on top of me. 1  I have been so unhappy that I have had difficulty sleeping. 0  I have felt sad or miserable. 1  I have been so unhappy that I have been crying. 1  The thought of harming myself has occurred to me. 0  Edinburgh Postnatal Depression Scale Total 7      After visit meds:  Allergies as of 03/06/2023   No Known Allergies      Medication List     STOP  taking these medications    acetaminophen 500 MG tablet Commonly known as: TYLENOL   multivitamin tablet   prenatal multivitamin Tabs tablet       TAKE these medications    ibuprofen 600 MG tablet Commonly known as: ADVIL Take 1 tablet (600 mg total) by mouth every 6 (six) hours as needed.               Discharge Care Instructions  (From admission, onward)           Start     Ordered   03/06/23 0000  Discharge wound care:       Comments: For a cesarean delivery: You may wash incision with soap and water.  Do not soak or submerge the incision for 2 weeks. Keep incision dry. You may need to keep a sanitary pad or panty liner between the incision and your clothing for comfort and to keep the incision dry. If you note drainage, increased pain, or increased redness of the incision, then please notify your physician.   03/06/23 1153   03/06/23 0000  If the dressing is still  on your incision site when you go home, remove it on the third day after your surgery date. Remove dressing if it begins to fall off, or if it is dirty or damaged before the third day.       Comments: For a cesarean delivery   03/06/23 1153             Discharge home in stable condition Infant Feeding: Breast Infant Disposition:home with mother Discharge instruction: per After Visit Summary and Postpartum booklet. Activity: Advance as tolerated. Pelvic rest for 6 weeks.  Diet: routine diet Anticipated Birth Control: Unsure Postpartum Appointment:4 weeks Future Appointments: Future Appointments  Date Time Provider Department Center  04/07/2023  2:40 PM Tobb, Lavona Mound, DO [facility] None   Follow up Visit:  Follow-up Information     [facility] Follow up in 4 week(s).   Why: For a postpartum Evaluation Contact information: [address]  [address] [place] [address] [phone]                     03/06/2023 Waynard Reeds, MD

## 2023-03-13 ENCOUNTER — Inpatient Hospital Stay (HOSPITAL_COMMUNITY): Payer: Medicaid Other

## 2023-03-30 ENCOUNTER — Ambulatory Visit: Payer: Medicaid Other | Admitting: Cardiology

## 2023-03-31 ENCOUNTER — Telehealth (HOSPITAL_COMMUNITY): Payer: Self-pay | Admitting: *Deleted

## 2023-03-31 NOTE — Telephone Encounter (Signed)
03/31/2023  Name: Janet Li MRN: [id number] DOB: December 15, 1990  Reason for Call:  Transition of Care Hospital Discharge Call  Contact Status: Patient Contact Status: Message  Language assistant needed: Interpreter Mode: Interpreter Not Needed        Follow-Up Questions:    Inocente Salles Postnatal Depression Scale:  In the Past 7 Days:    PHQ2-9 Depression Scale:     Discharge Follow-up:    Post-discharge interventions: NA  Salena Saner, RN 03/31/2023 11:17

## 2023-04-07 ENCOUNTER — Telehealth (INDEPENDENT_AMBULATORY_CARE_PROVIDER_SITE_OTHER): Payer: Medicaid Other | Admitting: Cardiology

## 2023-04-07 VITALS — BP 102/64 | HR 81 | Ht 64.0 in | Wt 200.0 lb

## 2023-04-07 DIAGNOSIS — R0602 Shortness of breath: Secondary | ICD-10-CM

## 2023-04-07 NOTE — Patient Instructions (Signed)
Medication Instructions:  Your physician recommends that you continue on your current medications as directed. Please refer to the Current Medication list given to you today.  *If you need a refill on your cardiac medications before your next appointment, please call your pharmacy*   Lab Work: None ordered   If you have labs (blood work) drawn today and your tests are completely normal, you will receive your results only by: [place] (if you have MyChart) OR A paper copy in the mail If you have any lab test that is abnormal or we need to change your treatment, we will call you to review the results.   Testing/Procedures: None ordered    Follow-Up: Follow up as scheduled   Other Instructions

## 2023-04-09 NOTE — Progress Notes (Signed)
[facility]  New Evaluation  Date:  04/09/2023   ID:  Janet Li, DOB [birth year], MRN [id number]  PCP:  Gordan Payment., MD   [facility] Providers Cardiologist:  Thomasene Ripple, DO  Electrophysiologist:  None       Referring MD: Gordan Payment., MD   Chief Complaint: " I am doing well"   Virtual Visit via Video  Note . I connected with the patient today by a   video enabled telemedicine application and verified that I am speaking with the correct person using two identifiers.  The patient is at home. I am in the office.   History of Present Illness:    Janet Li is a 32 y.o. female breast-feeding.  Who is being seen today for the evaluation of shortness of breath at the request of Gordan Payment., MD.   Since her last visit she delivered and is well.  Her baby is doing well.  She has no complaints at this time.  Prior CV Studies Reviewed: The following studies were reviewed today:  Reviewed previous testing  Past Medical History:  Diagnosis Date   Hx of varicella    Medical history non-contributory     Past Surgical History:  Procedure Laterality Date   APPENDECTOMY        OB History     Gravida  5   Para  4   Term  4   Preterm      AB  1   Living  4      SAB  1   IAB      Ectopic      Multiple  0   Live Births  4               Current Medications: Current Meds  Medication Sig   ibuprofen (ADVIL) 600 MG tablet Take 1 tablet (600 mg total) by mouth every 6 (six) hours as needed.   Multiple Vitamin (MULTIVITAMIN) tablet Take 1 tablet by mouth daily. Beef Organ   Prenatal Vit-Fe Fumarate-FA (PRENATAL MULTIVITAMIN) TABS tablet Take 1 tablet by mouth daily at 12 noon.     Allergies:   Patient has no known allergies.   Social History   Socioeconomic History   Marital status: Married    Spouse name: Danny    Number of children: Not on file   Years of education: Not on file   Highest education level:  Not on file  Occupational History   Not on file  Tobacco Use   Smoking status: Never   Smokeless tobacco: Never  Vaping Use   Vaping status: Never Used  Substance and Sexual Activity   Alcohol use: No   Drug use: No   Sexual activity: Yes  Other Topics Concern   Not on file  Social History Narrative   Not on file   Social Determinants of Health   Financial Resource Strain: Not on file  Food Insecurity: No Food Insecurity (03/04/2023)   Hunger Vital Sign    Worried About Running Out of Food in the Last Year: Never true    Ran Out of Food in the Last Year: Never true  Transportation Needs: No Transportation Needs (03/04/2023)   PRAPARE - Administrator, Civil Service (Medical): No    Lack of Transportation (Non-Medical): No  Physical Activity: Not on file  Stress: Not on file  Social Connections: Not on file      Family History  Problem Relation  Age of Onset   Hypertension Father       ROS:   Please see the history of present illness.     All other systems reviewed and are negative.   Labs/EKG Reviewed:    EKG:   EKG is was ordered today.  The ekg ordered today demonstrates Normal sinus rhythm HR 73 bpm   Recent Labs: 03/05/2023: Hemoglobin 11.7; Platelets 189   Recent Lipid Panel No results found for: "CHOL", "TRIG", "HDL", "CHOLHDL", "LDLCALC", "LDLDIRECT"  Physical Exam:    VS:  BP 102/64   Pulse 81   Ht 5\' 4"  (1.626 m)   Wt 200 lb (90.7 kg)   SpO2 97%   BMI 34.33 kg/m     Wt Readings from Last 3 Encounters:  04/07/23 200 lb (90.7 kg)  03/04/23 222 lb 3.2 oz (100.8 kg)  02/25/23 220 lb (99.8 kg)     GEN:  Well nourished, well developed in no acute distress HEENT: Normal NECK: No JVD; No carotid bruits LYMPHATICS: No lymphadenopathy CARDIAC: RRR, no murmurs, rubs, gallops RESPIRATORY:  Clear to auscultation without rales, wheezing or rhonchi  ABDOMEN: Soft, non-tender, non-distended MUSCULOSKELETAL:  No edema; No deformity  SKIN:  Warm and dry NEUROLOGIC:  Alert and oriented x 3 PSYCHIATRIC:  Normal affect    Risk Assessment/Risk Calculators:                  ASSESSMENT & PLAN:    Palpitations Shortness of breath   She is doing well from a cardiovascular standpoint.  No need for follow-up visit.  She will follow-up  The patient is in agreement with the above plan. The patient left the office in stable condition.  The patient will follow up in Patient Instructions  Medication Instructions:  Your physician recommends that you continue on your current medications as directed. Please refer to the Current Medication list given to you today.   *If you need a refill on your cardiac medications before your next appointment, please call your pharmacy*   Lab Work: None ordered   If you have labs (blood work) drawn today and your tests are completely normal, you will receive your results only by: MyChart Message (if you have MyChart) OR A paper copy in the mail If you have any lab test that is abnormal or we need to change your treatment, we will call you to review the results.   Testing/Procedures: None ordered    Follow-Up: Follow up as scheduled   Other Instructions     Dispo:  No follow-ups on file.   Medication Adjustments/Labs and Tests Ordered: Current medicines are reviewed at length with the patient today.  Concerns regarding medicines are outlined above.  Tests Ordered: No orders of the defined types were placed in this encounter.  Medication Changes: No orders of the defined types were placed in this encounter.

## 2023-08-20 IMAGING — CT CT ANGIO NECK
2 of 11 series · 7 of 33 positions shown · IV contrast (omnipaque)
Comparison: None.

CLINICAL DATA: Possible carotid artery dissection.

EXAM:
CT ANGIOGRAPHY HEAD AND NECK
TECHNIQUE: Multidetector CT imaging of the head and neck was performed using
the standard protocol during bolus administration of intravenous
contrast. Multiplanar CT image reconstructions and MIPs were
obtained to evaluate the vascular anatomy. Carotid stenosis
measurements (when applicable) are obtained utilizing NASCET
criteria, using the distal internal carotid diameter as the
denominator.
CONTRAST:  100mL OMNIPAQUE IOHEXOL 350 MG/ML SOLN

[Series 8: cta neck · axial · 0.58mm/px · z∈[-306,-184]mm · 2 of 185 slices shown]
[im 62/185  soft-tissue]
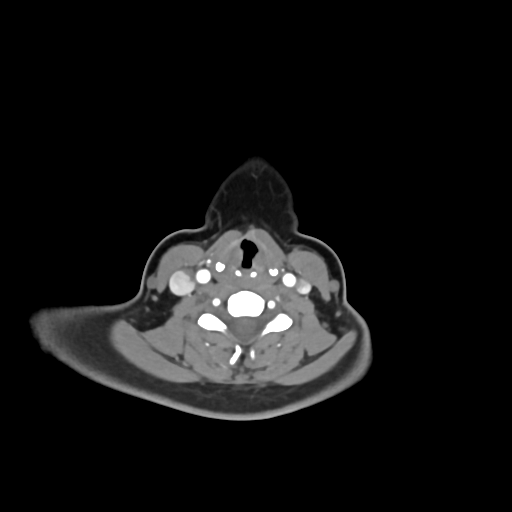
[im 123/185  soft-tissue]
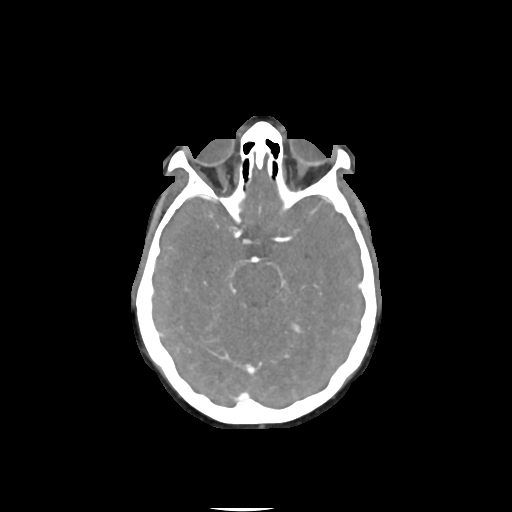

[Series 10: cta neck axial · axial · 0.39mm/px · z∈[-394,-153]mm · 5 of 369 slices shown]
[im 62/369  soft-tissue]
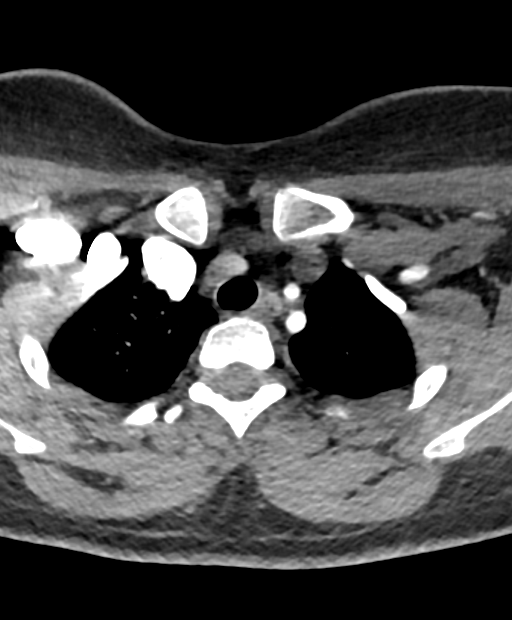
[im 123/369  bone]
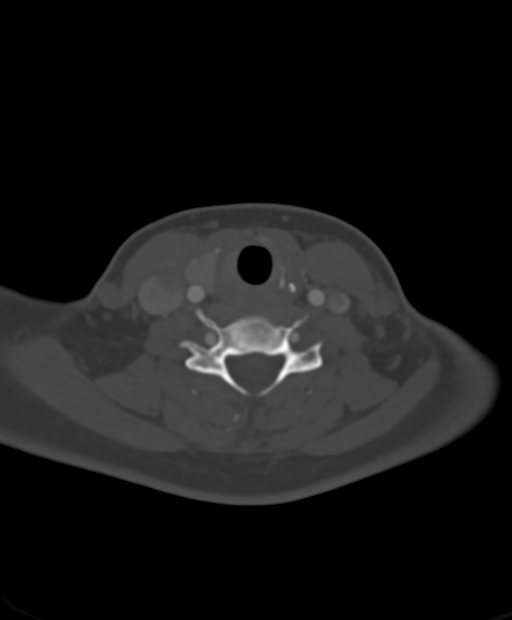
[im 185/369  soft-tissue]
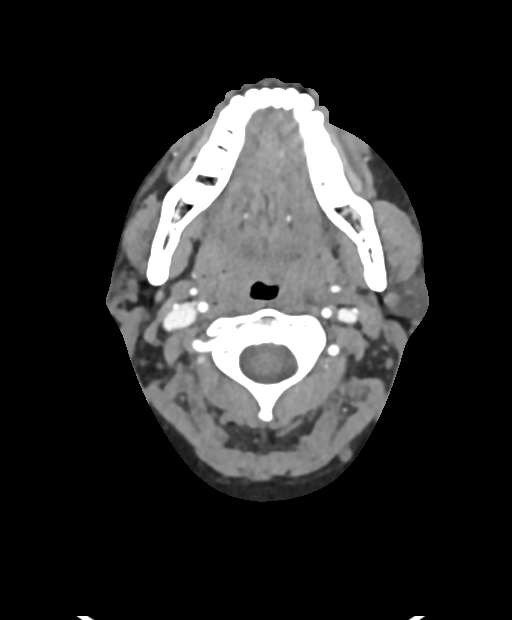
[im 246/369  bone]
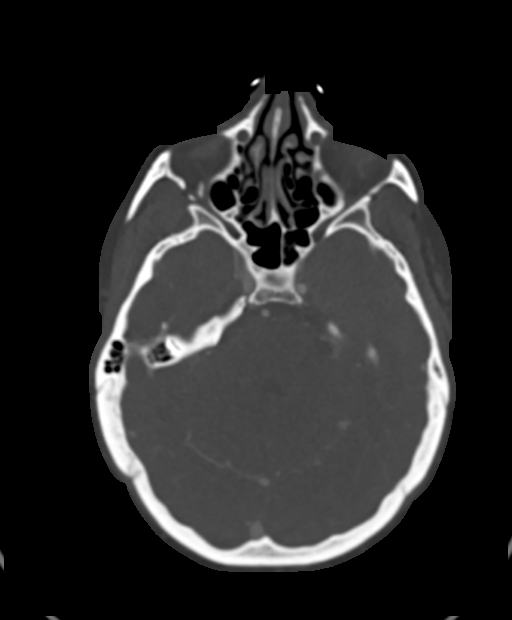
[im 307/369  soft-tissue]
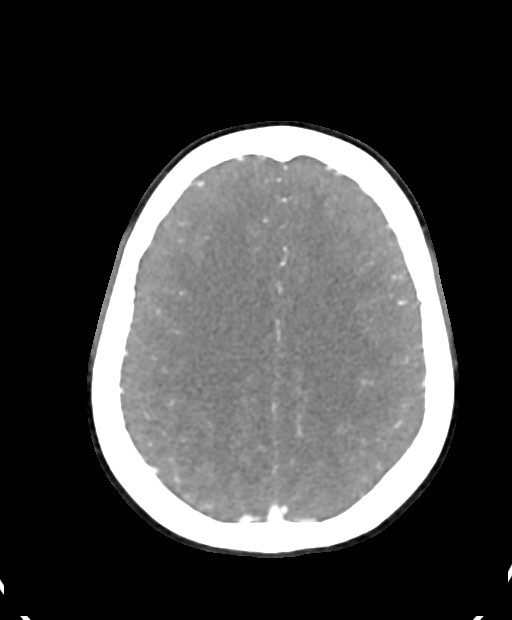

[7 of 33 positions shown; findings below may reference images not displayed]

FINDINGS: CT HEAD FINDINGS

Brain: There is no mass, hemorrhage or extra-axial collection. The
size and configuration of the ventricles and extra-axial CSF spaces
are normal. There is no acute or chronic infarction. The brain
parenchyma is normal.

Skull: The visualized skull base, calvarium and extracranial soft
tissues are normal.

Sinuses/Orbits: No fluid levels or advanced mucosal thickening of
the visualized paranasal sinuses. No mastoid or middle ear effusion.
The orbits are normal.

CTA NECK FINDINGS

SKELETON: There is no bony spinal canal stenosis. No lytic or
blastic lesion.

OTHER NECK: Normal pharynx, larynx and major salivary glands. No
cervical lymphadenopathy. Unremarkable thyroid gland.

UPPER CHEST: No pneumothorax or pleural effusion. No nodules or
masses.

AORTIC ARCH:

There is no calcific atherosclerosis of the aortic arch. There is no
aneurysm, dissection or hemodynamically significant stenosis of the
visualized portion of the aorta. Conventional 3 vessel aortic
branching pattern. The visualized proximal subclavian arteries are
widely patent.

RIGHT CAROTID SYSTEM: Normal without aneurysm, dissection or
stenosis.

LEFT CAROTID SYSTEM: Normal without aneurysm, dissection or
stenosis.

VERTEBRAL ARTERIES: Left dominant configuration. Both origins are
clearly patent. There is no dissection, occlusion or flow-limiting
stenosis to the skull base (V1-V3 segments).

CTA HEAD FINDINGS

POSTERIOR CIRCULATION:

--Vertebral arteries: Normal V4 segments.

--Inferior cerebellar arteries: Normal.

--Basilar artery: Normal.

--Superior cerebellar arteries: Normal.

--Posterior cerebral arteries (PCA): Normal.

ANTERIOR CIRCULATION:

--Intracranial internal carotid arteries: Normal.

--Anterior cerebral arteries (ACA): Normal. Both A1 segments are
present. Patent anterior communicating artery (a-comm).

--Middle cerebral arteries (MCA): Normal.

VENOUS SINUSES: As permitted by contrast timing, patent.

ANATOMIC VARIANTS: None

Review of the MIP images confirms the above findings.
IMPRESSION: Normal CTA of the head and neck.

## 2023-12-27 ENCOUNTER — Encounter: Payer: Self-pay | Admitting: Allergy and Immunology

## 2023-12-27 ENCOUNTER — Ambulatory Visit: Admitting: Allergy and Immunology

## 2023-12-27 VITALS — BP 100/58 | HR 80 | Resp 16 | Ht 64.0 in | Wt 193.6 lb

## 2023-12-27 DIAGNOSIS — R109 Unspecified abdominal pain: Secondary | ICD-10-CM

## 2023-12-27 DIAGNOSIS — D7219 Other eosinophilia: Secondary | ICD-10-CM

## 2023-12-27 DIAGNOSIS — L989 Disorder of the skin and subcutaneous tissue, unspecified: Secondary | ICD-10-CM | POA: Diagnosis not present

## 2023-12-27 MED ORDER — MOMETASONE FUROATE 0.1 % EX OINT: TOPICAL_OINTMENT | CUTANEOUS | 5 refills | Status: AC

## 2023-12-27 MED ORDER — LORATADINE 10 MG PO TABS: 10.0000 mg | ORAL_TABLET | Freq: Two times a day (BID) | ORAL | 5 refills | Status: AC

## 2023-12-27 NOTE — Patient Instructions (Addendum)
  1. Bath followed by mometasone 0.1% ointment applied while wet every day  2. Loratadine 10 mg - 1 tablet 2 times per day  3. Prednisone 10 mg - 1 tablet daily until return to clinic in 2 weeks  4. Obtain urine analysis for back pain.  5. If back pain continues will need ultrasound to look at kidneys  6. Return to clinic in 2 weeks or earlier if problem

## 2023-12-27 NOTE — Progress Notes (Unsigned)
 [place] - [place] - [place] - [place] - [place]   Dear Eluterio Hamburg,  Thank you for referring [name] to the [facility] [place]  on 12/27/2023.   Below is a summation of this patient's evaluation and recommendations.  Thank you for your referral. I will keep you informed about this patient's response to treatment.   If you have any questions please do not hesitate to contact me.   Sincerely,  Fabienne Holter, MD Allergy / Immunology [facility] [place]    ______________________________________________________________________    NEW PATIENT NOTE  Referring Provider: Abbe Hoard., MD Primary Provider: Abbe Hoard., MD Date of office visit: 12/27/2023    Subjective:   Chief Complaint:  Janet Li (DOB: [birth year]) is a 33 y.o. female who presents to the clinic on 12/27/2023 with a chief complaint of Rash .     HPI: Janet Li presents to this clinic in evaluation of dermatitis.  Her issue starts 2 days after undergoing a prolonged dental procedure requiring delivery of topical anesthetics.  She developed a red bumpy itchy rash mid-April across her body that has changed to some degree over the course of the past 3 to 4 weeks.  She now has less bumpy areas and the skin lesions appear to it flattened out and now they are starting the scale.  She has no associated systemic or constitutional symptoms.  However, for the past week she has been a little bit nauseated and feeling weak.  And she developed back pain today with a predilection affecting her right side more than her left side.  She has been treated with a systemic steroid injection on 16 December 2023 which really did not help her very much.  She saw dermatology on 21 Dec 2023 and was treated with cetirizine and triamcinolone which did not help her and in fact might of irritated her skin somewhat.  She has not started any new supplements or  health foods or energy boosters or over-the-counter agents and she has not started any new medications and she has not really had a significant change in her environment or symptoms suggesting an ongoing infectious disease.  Past Medical History:  Diagnosis Date  . Hx of varicella   . Medical history non-contributory     Past Surgical History:  Procedure Laterality Date  . APPENDECTOMY    . CYST REMOVAL HAND Left     Allergies as of 12/27/2023   No Known Allergies      Medication List    NUTRITIONAL SUPPLEMENT PO Take by mouth. Beef organ supplements        Review of systems negative except as noted in HPI / PMHx or noted below:  Review of Systems  Constitutional: Negative.   HENT: Negative.    Eyes: Negative.   Respiratory: Negative.    Cardiovascular: Negative.   Gastrointestinal: Negative.   Genitourinary: Negative.   Musculoskeletal: Negative.   Skin: Negative.   Neurological: Negative.   Endo/Heme/Allergies: Negative.   Psychiatric/Behavioral: Negative.      Family History  Problem Relation Age of Onset  . Hypertension Father   . Breast cancer Maternal Aunt   . Hypertension Paternal Grandfather     Social History   Socioeconomic History  . Marital status: Married    Spouse name: Goble Last   . Number of children: Not on file  . Years of education: Not on file  . Highest education level: Not on file  Occupational History  . Not on file  Tobacco Use  . Smoking status: Never  . Smokeless tobacco: Never  Vaping Use  . Vaping status: Never Used  Substance and Sexual Activity  . Alcohol use: No  . Drug use: No  . Sexual activity: Yes  Other Topics Concern  . Not on file  Social History Narrative  . Not on file   Social Drivers of Health   Financial Resource Strain: Not on file  Food Insecurity: Low Risk  (07/25/2023)   Received from [facility]   Hunger Vital Sign   . Worried About Programme researcher, broadcasting/film/video in the Last Year: Never true   . Ran  Out of Food in the Last Year: Never true  Transportation Needs: No Transportation Needs (07/25/2023)   Received from [facility]   . In the past 12 months, has lack of reliable transportation kept you from medical appointments, meetings, work or from getting things needed for daily living? : No  Physical Activity: Not on file  Stress: Not on file  Social Connections: Not on file  Intimate Partner Violence: Not At Risk (03/04/2023)   Humiliation, Afraid, Rape, and Kick questionnaire   . Fear of Current or Ex-Partner: No   . Emotionally Abused: No   . Physically Abused: No   . Sexually Abused: No    Environmental and Social history   Objective:   Vitals:   12/27/23 0920  BP: (!) 100/58  Pulse: 80  Resp: 16  SpO2: 98%   Height: 5\' 4"  (162.6 cm) Weight: 193 lb 9.6 oz (87.8 kg)  Physical Exam Constitutional:      Appearance: She is not diaphoretic.  HENT:     Head: Normocephalic.     Right Ear: Tympanic membrane, ear canal and external ear normal.     Left Ear: Tympanic membrane, ear canal and external ear normal.     Nose: Nose normal. No mucosal edema or rhinorrhea.     Mouth/Throat:     Pharynx: Uvula midline. No oropharyngeal exudate.  Eyes:     Conjunctiva/sclera: Conjunctivae normal.  Neck:     Thyroid: No thyromegaly.     Trachea: Trachea normal. No tracheal tenderness or tracheal deviation.  Cardiovascular:     Rate and Rhythm: Normal rate and regular rhythm.     Heart sounds: Normal heart sounds, S1 normal and S2 normal. No murmur heard. Pulmonary:     Effort: No respiratory distress.     Breath sounds: Normal breath sounds. No stridor. No wheezing or rales.  Musculoskeletal:     Comments: No lower back / flank tenderness  Lymphadenopathy:     Head:     Right side of head: No tonsillar adenopathy.     Left side of head: No tonsillar adenopathy.     Cervical: No cervical adenopathy.  Skin:    Findings: No erythema or rash (Diffuse patchy  erythematous indurated scaly dermatitis torso extremities neck).     Nails: There is no clubbing.  Neurological:     Mental Status: She is alert.    Diagnostics: Allergy skin tests were not performed.   Review of blood tests dated 25 Dec 2023 identifies WBC 6.9, absolute eosinophil 700, absolute lymphocyte 2000, hemoglobin 13.6, platelet 242, creatinine 0.5 mg/DL, AST 69G/E, ALT 9U/L, sed rate 15  Assessment and Plan:    1. Inflammatory dermatosis   2. Flank pain   3. Other eosinophilia     Patient Instructions  1. Bath followed by mometasone 0.1% ointment applied while wet every day  2. Loratadine 10 mg - 1 tablet 2 times per day  3. Prednisone 10 mg - 1 tablet daily until return to clinic in 2 weeks  4. Obtain urine analysis for back pain.  5. If back pain continues will need ultrasound to look at kidneys  6. Return to clinic in 2 weeks or earlier if problem   Fabienne Holter, MD Allergy / Immunology [facility] [place] 

## 2023-12-28 ENCOUNTER — Encounter: Payer: Self-pay | Admitting: Allergy and Immunology

## 2024-02-12 ENCOUNTER — Ambulatory Visit: Admitting: Allergy and Immunology

## 2024-02-29 ENCOUNTER — Ambulatory Visit: Admitting: Allergy and Immunology

## 2024-03-07 ENCOUNTER — Ambulatory Visit (INDEPENDENT_AMBULATORY_CARE_PROVIDER_SITE_OTHER): Admitting: Allergy and Immunology

## 2024-03-07 DIAGNOSIS — T50905D Adverse effect of unspecified drugs, medicaments and biological substances, subsequent encounter: Secondary | ICD-10-CM

## 2024-03-07 DIAGNOSIS — Z888 Allergy status to other drugs, medicaments and biological substances status: Secondary | ICD-10-CM

## 2024-03-07 DIAGNOSIS — L989 Disorder of the skin and subcutaneous tissue, unspecified: Secondary | ICD-10-CM

## 2024-03-07 DIAGNOSIS — D7219 Other eosinophilia: Secondary | ICD-10-CM

## 2024-03-07 NOTE — Progress Notes (Signed)
 Janet Li returns to this clinic in evaluation of an inflammatory dermatosis believed secondary to topical anesthetic exposure.  She fortunately has resolved all of her dermatitis and her skin is back to normal at this point.  In review, she used two amide topical anesthetics during a dental procedure including articaine and bupivacaine .  Within 48 hours she developed a significant inflammatory dermatosis associated with eosinophilia with no other organ involvement that lasted approximately 4 to 6 weeks treated with systemic steroids.  The question at hand is whether or not she can have exposure to topical anesthetics in the future.  Given the fact that her delayed hypersensitivity reaction appeared to occur with the administration of 2 types of amide topical anesthetic I think she should remain away from the entire family of amides.  Her dentist can use an Ester topical anesthetic and we can certainly have her return to this clinic to undergo some clinical testing with that agent prior to having her oral procedure performed.  And we will follow-up with a CBC with differential to see the status of her preexistent eosinophilia that was associated with this apparent topical anesthetic hypersensitivity reaction.

## 2024-03-07 NOTE — Patient Instructions (Addendum)
  A. Remain away from 'Amide topical anesthetics  Mepivacaine Lidocaine  Etidocaine Bupivacaine  Levobupivacaine Ropivacaine  B. Can attempt to use Ester topical anesthetics   Procaine Cocaine Chloroprocaine Tetracaine Benzocaine   C. Can proceed with in clinic testing pending decision by dentist  D. Blood - CBC w/d for eosinophilia

## 2024-03-19 LAB — CBC WITH DIFF/PLATELET
Basophils Absolute: 0 x10E3/uL (ref 0.0–0.2)
Basos: 0 %
EOS (ABSOLUTE): 0.2 x10E3/uL (ref 0.0–0.4)
Eos: 3 %
Hematocrit: 38.7 % (ref 34.0–46.6)
Hemoglobin: 12.1 g/dL (ref 11.1–15.9)
Immature Grans (Abs): 0 x10E3/uL (ref 0.0–0.1)
Immature Granulocytes: 0 %
Lymphocytes Absolute: 2.6 x10E3/uL (ref 0.7–3.1)
Lymphs: 32 %
MCH: 28.1 pg (ref 26.6–33.0)
MCHC: 31.3 g/dL — ABNORMAL LOW (ref 31.5–35.7)
MCV: 90 fL (ref 79–97)
Monocytes Absolute: 0.4 x10E3/uL (ref 0.1–0.9)
Monocytes: 6 %
Neutrophils Absolute: 4.7 x10E3/uL (ref 1.4–7.0)
Neutrophils: 59 %
Platelets: 235 x10E3/uL (ref 150–450)
RBC: 4.3 x10E6/uL (ref 3.77–5.28)
RDW: 13.2 % (ref 11.7–15.4)
WBC: 7.9 x10E3/uL (ref 3.4–10.8)

## 2024-03-21 ENCOUNTER — Ambulatory Visit: Payer: Self-pay | Admitting: Allergy and Immunology

## 2024-04-17 ENCOUNTER — Ambulatory Visit: Admitting: Allergy and Immunology

## 2024-04-17 DIAGNOSIS — Z888 Allergy status to other drugs, medicaments and biological substances status: Secondary | ICD-10-CM

## 2024-04-17 DIAGNOSIS — T50905D Adverse effect of unspecified drugs, medicaments and biological substances, subsequent encounter: Secondary | ICD-10-CM

## 2024-04-17 DIAGNOSIS — L989 Disorder of the skin and subcutaneous tissue, unspecified: Secondary | ICD-10-CM

## 2024-04-17 MED ORDER — MOMETASONE FUROATE 0.1 % EX CREA: TOPICAL_CREAM | CUTANEOUS | 5 refills | Status: AC

## 2024-04-17 NOTE — Patient Instructions (Signed)
  A. Remain away from 'Amide topical anesthetics  Mepivacaine Lidocaine  Etidocaine Bupivacaine  Levobupivacaine Ropivacaine  B. Can attempt to use Ester topical anesthetics   Procaine Cocaine Chloroprocaine Tetracaine Benzocaine   C. Can proceed with in clinic testing pending decision by dentist  D. For inflammatory dermatosis:   A. Mometasone  0.1% cream 1-2 times per day  B. Cetirizine 10 mg - 1 tablet 1-2 times per day  C. Further evaluation and treatment???

## 2024-04-18 ENCOUNTER — Encounter: Payer: Self-pay | Admitting: Allergy and Immunology

## 2024-04-18 NOTE — Progress Notes (Signed)
 Janet Li presents to this clinic to have skin testing for topical anesthetic hypersensitivity.  Unfortunately, the samples that she presented today were all amide topical anesthetics and based upon her previous reactivity that may have developed with amide topical anesthetics I do not feel comfortable exposing her to these agents at this point.  We gave her a list of Ester topical anesthetics that she can acquire from her dentist for skin testing.  And she has also developed a rash around her thighs that is been intensely itchy.  We gave her some topical mometasone  to use along with some antihistamines and she will keep in contact with us  noting her response to this treatment.

## 2024-04-25 DIAGNOSIS — E611 Iron deficiency: Secondary | ICD-10-CM | POA: Diagnosis not present

## 2024-04-25 DIAGNOSIS — M545 Low back pain, unspecified: Secondary | ICD-10-CM | POA: Diagnosis not present

## 2024-04-25 DIAGNOSIS — R21 Rash and other nonspecific skin eruption: Secondary | ICD-10-CM | POA: Diagnosis not present

## 2024-04-25 DIAGNOSIS — L509 Urticaria, unspecified: Secondary | ICD-10-CM | POA: Diagnosis not present

## 2024-05-01 DIAGNOSIS — Z130 Encounter for screening for diseases of the blood and blood-forming organs and certain disorders involving the immune mechanism: Secondary | ICD-10-CM | POA: Diagnosis not present

## 2024-05-01 DIAGNOSIS — R319 Hematuria, unspecified: Secondary | ICD-10-CM | POA: Diagnosis not present

## 2024-05-01 DIAGNOSIS — Z01419 Encounter for gynecological examination (general) (routine) without abnormal findings: Secondary | ICD-10-CM | POA: Diagnosis not present

## 2024-05-01 DIAGNOSIS — L282 Other prurigo: Secondary | ICD-10-CM | POA: Diagnosis not present

## 2024-05-01 DIAGNOSIS — Z3009 Encounter for other general counseling and advice on contraception: Secondary | ICD-10-CM | POA: Diagnosis not present

## 2024-05-01 DIAGNOSIS — Z1389 Encounter for screening for other disorder: Secondary | ICD-10-CM | POA: Diagnosis not present

## 2024-05-14 DIAGNOSIS — J3089 Other allergic rhinitis: Secondary | ICD-10-CM | POA: Diagnosis not present

## 2024-05-14 DIAGNOSIS — J301 Allergic rhinitis due to pollen: Secondary | ICD-10-CM | POA: Diagnosis not present

## 2024-05-14 DIAGNOSIS — R21 Rash and other nonspecific skin eruption: Secondary | ICD-10-CM | POA: Diagnosis not present

## 2024-08-05 DIAGNOSIS — L299 Pruritus, unspecified: Secondary | ICD-10-CM | POA: Diagnosis not present

## 2024-08-05 DIAGNOSIS — L300 Nummular dermatitis: Secondary | ICD-10-CM | POA: Diagnosis not present

## 2024-08-05 DIAGNOSIS — L308 Other specified dermatitis: Secondary | ICD-10-CM | POA: Diagnosis not present

## 2024-08-05 DIAGNOSIS — D485 Neoplasm of uncertain behavior of skin: Secondary | ICD-10-CM | POA: Diagnosis not present
